# Patient Record
Sex: Female | Born: 1950 | Race: White | Hispanic: No | Smoking: Never smoker
Health system: Southern US, Community
[De-identification: ages and names within clinical notes are randomized; demographics above are authoritative.]

## PROBLEM LIST (undated history)

## (undated) DIAGNOSIS — K2210 Ulcer of esophagus without bleeding: Secondary | ICD-10-CM

## (undated) DIAGNOSIS — M810 Age-related osteoporosis without current pathological fracture: Secondary | ICD-10-CM

## (undated) DIAGNOSIS — D126 Benign neoplasm of colon, unspecified: Secondary | ICD-10-CM

## (undated) DIAGNOSIS — K648 Other hemorrhoids: Secondary | ICD-10-CM

## (undated) DIAGNOSIS — K449 Diaphragmatic hernia without obstruction or gangrene: Secondary | ICD-10-CM

## (undated) DIAGNOSIS — K219 Gastro-esophageal reflux disease without esophagitis: Secondary | ICD-10-CM

## (undated) DIAGNOSIS — E785 Hyperlipidemia, unspecified: Secondary | ICD-10-CM

## (undated) DIAGNOSIS — K317 Polyp of stomach and duodenum: Secondary | ICD-10-CM

## (undated) HISTORY — DX: Gastro-esophageal reflux disease without esophagitis: K21.9

## (undated) HISTORY — DX: Diaphragmatic hernia without obstruction or gangrene: K44.9

## (undated) HISTORY — PX: CARPAL TUNNEL RELEASE: SHX101

## (undated) HISTORY — DX: Ulcer of esophagus without bleeding: K22.10

## (undated) HISTORY — DX: Other hemorrhoids: K64.8

## (undated) HISTORY — DX: Age-related osteoporosis without current pathological fracture: M81.0

## (undated) HISTORY — DX: Benign neoplasm of colon, unspecified: D12.6

## (undated) HISTORY — DX: Polyp of stomach and duodenum: K31.7

## (undated) HISTORY — PX: FOOT SURGERY: SHX648

## (undated) HISTORY — PX: BREAST ENHANCEMENT SURGERY: SHX7

## (undated) HISTORY — DX: Hyperlipidemia, unspecified: E78.5

---

## 1992-08-12 DIAGNOSIS — D126 Benign neoplasm of colon, unspecified: Secondary | ICD-10-CM

## 1992-08-12 HISTORY — DX: Benign neoplasm of colon, unspecified: D12.6

## 1999-09-21 ENCOUNTER — Encounter: Payer: Self-pay | Admitting: Obstetrics and Gynecology

## 1999-09-21 ENCOUNTER — Encounter: Admission: RE | Admit: 1999-09-21 | Discharge: 1999-09-21 | Payer: Self-pay | Admitting: Obstetrics and Gynecology

## 2000-10-03 ENCOUNTER — Encounter: Admission: RE | Admit: 2000-10-03 | Discharge: 2000-10-03 | Payer: Self-pay | Admitting: Obstetrics and Gynecology

## 2000-10-03 ENCOUNTER — Encounter: Payer: Self-pay | Admitting: Obstetrics and Gynecology

## 2001-10-09 ENCOUNTER — Encounter: Admission: RE | Admit: 2001-10-09 | Discharge: 2001-10-09 | Payer: Self-pay | Admitting: Obstetrics and Gynecology

## 2001-10-09 ENCOUNTER — Encounter: Payer: Self-pay | Admitting: Obstetrics and Gynecology

## 2002-10-11 ENCOUNTER — Encounter: Admission: RE | Admit: 2002-10-11 | Discharge: 2002-10-11 | Payer: Self-pay | Admitting: Obstetrics and Gynecology

## 2002-10-11 ENCOUNTER — Encounter: Payer: Self-pay | Admitting: Obstetrics and Gynecology

## 2003-12-08 ENCOUNTER — Ambulatory Visit (HOSPITAL_COMMUNITY): Admission: RE | Admit: 2003-12-08 | Discharge: 2003-12-08 | Payer: Self-pay | Admitting: Obstetrics and Gynecology

## 2007-09-21 ENCOUNTER — Ambulatory Visit: Payer: Self-pay | Admitting: Gastroenterology

## 2007-10-05 ENCOUNTER — Ambulatory Visit: Payer: Self-pay | Admitting: Gastroenterology

## 2010-09-05 ENCOUNTER — Encounter: Payer: Self-pay | Admitting: Gastroenterology

## 2010-09-13 NOTE — Letter (Signed)
Summary: Colonoscopy Letter  [facility]  [address] [place], [place] [address]   Phone: [phone]  Fax: [phone]      September 05, 2010 MRN: [id number]   ORALIA CRIGER [address] [place], [place]  [address]   Dear Ms. Marcell,   According to your medical record, it is time for you to schedule a Colonoscopy. The American Cancer Society recommends this procedure as a method to detect early colon cancer. Patients with a family history of colon cancer, or a personal history of colon polyps or inflammatory bowel disease are at increased risk.  This letter has been generated based on the recommendations made at the time of your procedure. If you feel that in your particular situation this may no longer apply, please contact our office.  Please call our office at [phone] to schedule this appointment or to update your records at your earliest convenience.  Thank you for cooperating with [place] to provide you with the very best care possible.   Sincerely,  Judie Petit T. Russella Dar, M.D.  [facility] [phone]

## 2011-10-25 ENCOUNTER — Other Ambulatory Visit (HOSPITAL_COMMUNITY)
Admission: RE | Admit: 2011-10-25 | Discharge: 2011-10-25 | Disposition: A | Discharge status: Home / Self Care | Payer: 59 | Source: Ambulatory Visit | Attending: Obstetrics and Gynecology | Admitting: Obstetrics and Gynecology

## 2011-10-25 DIAGNOSIS — Z01419 Encounter for gynecological examination (general) (routine) without abnormal findings: Secondary | ICD-10-CM | POA: Insufficient documentation

## 2012-04-27 ENCOUNTER — Encounter: Payer: Self-pay | Admitting: Gastroenterology

## 2012-07-24 ENCOUNTER — Other Ambulatory Visit: Payer: Self-pay | Admitting: Obstetrics and Gynecology

## 2012-07-24 DIAGNOSIS — Z780 Asymptomatic menopausal state: Secondary | ICD-10-CM

## 2012-08-17 ENCOUNTER — Other Ambulatory Visit: Payer: Self-pay | Admitting: Obstetrics and Gynecology

## 2012-08-17 DIAGNOSIS — Z9882 Breast implant status: Secondary | ICD-10-CM

## 2012-08-17 DIAGNOSIS — Z1231 Encounter for screening mammogram for malignant neoplasm of breast: Secondary | ICD-10-CM

## 2012-08-20 ENCOUNTER — Encounter: Payer: Self-pay | Admitting: Gastroenterology

## 2012-08-20 ENCOUNTER — Ambulatory Visit (INDEPENDENT_AMBULATORY_CARE_PROVIDER_SITE_OTHER): Payer: Managed Care, Other (non HMO) | Admitting: Gastroenterology

## 2012-08-20 VITALS — BP 104/58 | HR 69 | Ht 61.5 in | Wt 129.4 lb

## 2012-08-20 DIAGNOSIS — Z860100 Personal history of colon polyps, unspecified: Secondary | ICD-10-CM | POA: Insufficient documentation

## 2012-08-20 DIAGNOSIS — Z800 Family history of malignant neoplasm of digestive organs: Secondary | ICD-10-CM

## 2012-08-20 DIAGNOSIS — R1012 Left upper quadrant pain: Secondary | ICD-10-CM

## 2012-08-20 DIAGNOSIS — R140 Abdominal distension (gaseous): Secondary | ICD-10-CM

## 2012-08-20 DIAGNOSIS — R143 Flatulence: Secondary | ICD-10-CM

## 2012-08-20 MED ORDER — PEG-KCL-NACL-NASULF-NA ASC-C 100 G PO SOLR: 1.0000 | Freq: Once | ORAL | Status: DC

## 2012-08-20 NOTE — Progress Notes (Signed)
History of Present Illness: This is a 62 year old female with a personal history of adenomatous colon polyps and a family history of colon cancer. She complains of frequent left upper quadrant pain associated with gas bloating and belching. Her symptoms seem to be more bothersome when under stress. Her last colonoscopy was normal in February 2009. Denies weight loss, constipation, diarrhea, change in stool caliber, melena, hematochezia, nausea, vomiting, dysphagia, reflux symptoms, chest pain.  Review of Systems: Pertinent positive and negative review of systems were noted in the above HPI section. All other review of systems were otherwise negative.  Current Medications, Allergies, Past Medical History, Past Surgical History, Family History and Social History were reviewed in [employer] record.  Physical Exam: General: Well developed , well nourished, no acute distress Head: Normocephalic and atraumatic Eyes:  sclerae anicteric, EOMI Ears: Normal auditory acuity Mouth: No deformity or lesions Neck: Supple, no masses or thyromegaly Lungs: Clear throughout to auscultation Heart: Regular rate and rhythm; no murmurs, rubs or bruits Abdomen: Soft, non tender and non distended. No masses, hepatosplenomegaly or hernias noted. Normal Bowel sounds Rectal: Deferred to colonoscopy Musculoskeletal: Symmetrical with no gross deformities  Skin: No lesions on visible extremities Pulses:  Normal pulses noted Extremities: No clubbing, cyanosis, edema or deformities noted Neurological: Alert oriented x 4, grossly nonfocal Cervical Nodes:  No significant cervical adenopathy Inguinal Nodes: No significant inguinal adenopathy Psychological:  Alert and cooperative. Normal mood and affect  Assessment and Recommendations:  1. Intermittent left upper quadrant pain associated with gas bloating and belching. Rule out gastritis, GERD and gas related pain. TUMS when necessary and Gas-X 4  times a day when necessary. Schedule upper endoscopy. The risks, benefits, and alternatives to endoscopy with possible biopsy and possible dilation were discussed with the patient and they consent to proceed.   2. Personal history of adenomatous colon polyps and a brother with colon cancer at age 52. She is due for surveillance colonoscopy. The risks, benefits, and alternatives to colonoscopy with possible biopsy and possible polypectomy were discussed with the patient and they consent to proceed.

## 2012-08-20 NOTE — Patient Instructions (Addendum)
You have been scheduled for an endoscopy and colonoscopy with propofol. Please follow the written instructions given to you at your visit today. Please pick up your prep at the pharmacy within the next 1-3 days. If you use inhalers (even only as needed) or a CPAP machine, please bring them with you on the day of your procedure.  Use over the counter Gas-X four times a day as needed for gas and bloating.

## 2012-09-15 ENCOUNTER — Other Ambulatory Visit: Payer: 59

## 2012-09-21 ENCOUNTER — Other Ambulatory Visit: Payer: 59

## 2012-09-29 ENCOUNTER — Encounter: Payer: 59 | Admitting: Gastroenterology

## 2012-10-26 ENCOUNTER — Ambulatory Visit
Admission: RE | Admit: 2012-10-26 | Discharge: 2012-10-26 | Disposition: A | Discharge status: Home / Self Care | Payer: Managed Care, Other (non HMO) | Source: Ambulatory Visit | Attending: Obstetrics and Gynecology | Admitting: Obstetrics and Gynecology

## 2012-10-26 DIAGNOSIS — Z1231 Encounter for screening mammogram for malignant neoplasm of breast: Secondary | ICD-10-CM

## 2012-10-26 DIAGNOSIS — Z9882 Breast implant status: Secondary | ICD-10-CM

## 2012-10-26 DIAGNOSIS — Z780 Asymptomatic menopausal state: Secondary | ICD-10-CM

## 2012-11-05 ENCOUNTER — Encounter: Payer: Self-pay | Admitting: Gastroenterology

## 2012-11-05 ENCOUNTER — Ambulatory Visit (AMBULATORY_SURGERY_CENTER): Payer: Managed Care, Other (non HMO) | Admitting: Gastroenterology

## 2012-11-05 VITALS — BP 108/58 | HR 67 | Temp 98.6°F | Resp 14 | Ht 61.5 in | Wt 129.0 lb

## 2012-11-05 DIAGNOSIS — Z1211 Encounter for screening for malignant neoplasm of colon: Secondary | ICD-10-CM

## 2012-11-05 DIAGNOSIS — D131 Benign neoplasm of stomach: Secondary | ICD-10-CM

## 2012-11-05 DIAGNOSIS — K317 Polyp of stomach and duodenum: Secondary | ICD-10-CM

## 2012-11-05 DIAGNOSIS — Z860100 Personal history of colon polyps, unspecified: Secondary | ICD-10-CM

## 2012-11-05 DIAGNOSIS — Z800 Family history of malignant neoplasm of digestive organs: Secondary | ICD-10-CM

## 2012-11-05 DIAGNOSIS — K209 Esophagitis, unspecified without bleeding: Secondary | ICD-10-CM

## 2012-11-05 DIAGNOSIS — R1012 Left upper quadrant pain: Secondary | ICD-10-CM

## 2012-11-05 MED ORDER — OMEPRAZOLE 40 MG PO CPDR: 40.0000 mg | DELAYED_RELEASE_CAPSULE | Freq: Every day | ORAL | Status: DC

## 2012-11-05 MED ORDER — SODIUM CHLORIDE 0.9 % IV SOLN: 500.0000 mL | INTRAVENOUS | Status: DC

## 2012-11-05 NOTE — Op Note (Signed)
[facility] [address] [place], [address]   COLONOSCOPY PROCEDURE REPORT  PATIENT: [name] [name]  MR#: [id number] BIRTHDATE: [birth year] , 61  yrs. old GENDER: Female ENDOSCOPIST: Meryl Dare, MD, [facility] PROCEDURE DATE:  11/05/2012 PROCEDURE:   Colonoscopy, screening ASA CLASS:   Class II INDICATIONS:Patient's personal history of adenomatous colon polyps and Patient's immediate family history of colon cancer. MEDICATIONS: MAC sedation, administered by CRNA and propofol (Diprivan) 250mg  IV DESCRIPTION OF PROCEDURE:   After the risks benefits and alternatives of the procedure were thoroughly explained, informed consent was obtained.  A digital rectal exam revealed no abnormalities of the rectum.   The LB CF-H180AL [id number]  endoscope was introduced through the anus and advanced to the cecum, which was identified by both the appendix and ileocecal valve. No adverse events experienced with a tortuous colon.   The quality of the prep was good, using MoviPrep  The instrument was then slowly withdrawn as the colon was fully examined.  COLON FINDINGS: A normal appearing cecum, ileocecal valve, and appendiceal orifice were identified.  The ascending, hepatic flexure, transverse, splenic flexure, descending, sigmoid colon and rectum appeared unremarkable.  No polyps or cancers were seen. Retroflexed views revealed small internal hemorrhoids. The time to cecum=3 minutes 52 seconds.  Withdrawal time=9 minutes 15 seconds. The scope was withdrawn and the procedure completed.  COMPLICATIONS: There were no complications.  ENDOSCOPIC IMPRESSION: 1.  Normal colon 2.  Small internal hemorrhoids  RECOMMENDATIONS: 1.  Repeat Colonoscopy in 5 years.  eSigned:  Meryl Dare, MD, [facility] 11/05/2012 10:24 AM

## 2012-11-05 NOTE — Patient Instructions (Addendum)

## 2012-11-05 NOTE — Progress Notes (Signed)
Patient did not experience any of the following events: a burn prior to discharge; a fall within the facility; wrong site/side/patient/procedure/implant event; or a hospital transfer or hospital admission upon discharge from the facility. (G8907)Patient did not have preoperative order for IV antibiotic SSI prophylaxis. [phone])  Took pt a while to pass gas but pt finally passing some air before she went home

## 2012-11-05 NOTE — Progress Notes (Signed)
Called to room to assist during endoscopic procedure.  Patient ID and intended procedure confirmed with present staff. Received instructions for my participation in the procedure from the performing physician.  

## 2012-11-05 NOTE — Op Note (Signed)
[facility] [address] [place], [address]   ENDOSCOPY PROCEDURE REPORT  PATIENT: [name] [name], [name] [name]  MR#: [id number] BIRTHDATE: 08-23-1950 , 61  yrs. old GENDER: Female ENDOSCOPIST: Meryl Dare, MD, [facility] PROCEDURE DATE:  11/05/2012 PROCEDURE:  EGD w/ biopsy ASA CLASS:     Class II INDICATIONS:  abdominal pain in upper left quadrant. MEDICATIONS: There was residual sedation effect present from prior procedure, MAC sedation, administered by CRNA, propofol (Diprivan) 100mg  IV TOPICAL ANESTHETIC: Cetacaine Spray DESCRIPTION OF PROCEDURE: After the risks benefits and alternatives of the procedure were thoroughly explained, informed consent was obtained.  The LB GIF-H180 [id number] endoscope was introduced through the mouth and advanced to the second portion of the duodenum without limitations.  The instrument was slowly withdrawn as the mucosa was fully examined.  ESOPHAGUS: There was LA Class B erosive esophagitis noted.   The esophagus was otherwise normal. STOMACH: Multiple smooth sessile polyps ranging between 3-72mm in size were found in the gastric body.  Multiple biopsies was performed.   The stomach otherwise appeared normal. DUODENUM: The duodenal mucosa showed no abnormalities in the bulb and second portion of the duodenum.  Retroflexed views revealed a small hiatal hernia.  The scope was then withdrawn from the patient and the procedure completed.  COMPLICATIONS: There were no complications.  ENDOSCOPIC IMPRESSION: 1.   LA Class B erosvie esophagitis 2.   Small hiatal hernia 3.   Multiple sessile polyps ranging between 3-76mm in size were found in the gastric body  RECOMMENDATIONS: 1.  Anti-reflux regimen long term 2.  PPI qam long term: omeprazole 40 mg po qam, 1 year of refills 3.  OP follow-up in 4-6 weeks. 4.  Await pathology results   eSigned:  Meryl Dare, MD, [facility] 11/05/2012 10:33 AM

## 2012-11-06 ENCOUNTER — Telehealth: Payer: Self-pay | Admitting: *Deleted

## 2012-11-06 NOTE — Telephone Encounter (Signed)
Mailbox not set up, follow-up  

## 2012-11-10 ENCOUNTER — Encounter: Payer: Self-pay | Admitting: Gastroenterology

## 2012-12-04 ENCOUNTER — Ambulatory Visit: Payer: Managed Care, Other (non HMO) | Admitting: Gastroenterology

## 2013-11-25 ENCOUNTER — Telehealth: Payer: Self-pay | Admitting: Gastroenterology

## 2013-11-25 DIAGNOSIS — R1012 Left upper quadrant pain: Secondary | ICD-10-CM

## 2013-11-25 DIAGNOSIS — K209 Esophagitis, unspecified without bleeding: Secondary | ICD-10-CM

## 2013-11-26 MED ORDER — OMEPRAZOLE 40 MG PO CPDR: 40.0000 mg | DELAYED_RELEASE_CAPSULE | Freq: Every day | ORAL | Status: DC

## 2013-11-26 NOTE — Telephone Encounter (Signed)
Informed patient that I can send a new prescription to her pharmacy. Asked patient why she did not pick up the prescription last year after her EGD. Patient states she has lost her prescription and only has reflux symptoms as needed. Told patient that we sent it to her pharmacy last year electronically and she it supposed to take this medication every day. Told patient she needs to make an appt for any more refills but I can send one until her follow up visit. Appt scheduled for 12/20/13.

## 2013-12-20 ENCOUNTER — Ambulatory Visit: Payer: Managed Care, Other (non HMO) | Admitting: Gastroenterology

## 2014-01-10 ENCOUNTER — Ambulatory Visit (INDEPENDENT_AMBULATORY_CARE_PROVIDER_SITE_OTHER): Payer: BC Managed Care – PPO | Admitting: Gastroenterology

## 2014-01-10 ENCOUNTER — Encounter: Payer: Self-pay | Admitting: Gastroenterology

## 2014-01-10 VITALS — BP 100/64 | HR 72 | Ht 61.5 in | Wt 135.8 lb

## 2014-01-10 DIAGNOSIS — K219 Gastro-esophageal reflux disease without esophagitis: Secondary | ICD-10-CM

## 2014-01-10 DIAGNOSIS — K209 Esophagitis, unspecified without bleeding: Secondary | ICD-10-CM

## 2014-01-10 DIAGNOSIS — R1012 Left upper quadrant pain: Secondary | ICD-10-CM

## 2014-01-10 MED ORDER — OMEPRAZOLE 40 MG PO CPDR: 40.0000 mg | DELAYED_RELEASE_CAPSULE | Freq: Every day | ORAL | Status: DC

## 2014-01-10 NOTE — Patient Instructions (Signed)
We have sent the following medications to your pharmacy for you to pick up at your convenience:omeprazole.  Thank you for choosing me and [facility].  Malcolm T. Stark, Jr., MD., FACG   

## 2014-01-10 NOTE — Progress Notes (Signed)
    History of Present Illness: This is a 63 year old female with GERD. EGD performed in 10/2012 showed LA class B erosive esophagitis, small hiatal hernia and benign fundic gland polyps. While off medication she noted frequent reflux symptoms. Upon resuming omeprazole her symptoms abated.  Current Medications, Allergies, Past Medical History, Past Surgical History, Family History and Social History were reviewed in [employer] record.  Physical Exam: General: Well developed , well nourished, no acute distress Head: Normocephalic and atraumatic Eyes:  sclerae anicteric, EOMI Ears: Normal auditory acuity Mouth: No deformity or lesions Lungs: Clear throughout to auscultation Heart: Regular rate and rhythm; no murmurs, rubs or bruits Abdomen: Soft, non tender and non distended. No masses, hepatosplenomegaly or hernias noted. Normal Bowel sounds Musculoskeletal: Symmetrical with no gross deformities  Pulses:  Normal pulses noted Extremities: No clubbing, cyanosis, edema or deformities noted Neurological: Alert oriented x 4, grossly nonfocal Psychological:  Alert and cooperative. Normal mood and affect  Assessment and Recommendations:  1. GERD with LA class B erosive esophagitis. Long-term antireflux measures and omeprazole 40 mg daily.  2. Family history of colon cancer. Surveillance colonoscopy recommended March 2019.

## 2014-02-01 ENCOUNTER — Telehealth: Payer: Self-pay | Admitting: Gastroenterology

## 2014-02-01 NOTE — Telephone Encounter (Signed)
Message copied by Oliva Bustard on Tue Feb 01, 2014  3:58 PM ------      Message from: Marzella Schlein      Created: Mon Dec 20, 2013  9:09 AM       Bill patient for same day cancellation ------

## 2014-03-26 ENCOUNTER — Encounter (HOSPITAL_COMMUNITY): Payer: Self-pay | Admitting: Emergency Medicine

## 2014-03-26 ENCOUNTER — Emergency Department (INDEPENDENT_AMBULATORY_CARE_PROVIDER_SITE_OTHER)
Admission: EM | Admit: 2014-03-26 | Discharge: 2014-03-26 | Disposition: A | Discharge status: Home / Self Care | Payer: BC Managed Care – PPO | Source: Home / Self Care

## 2014-03-26 DIAGNOSIS — T63461A Toxic effect of venom of wasps, accidental (unintentional), initial encounter: Secondary | ICD-10-CM

## 2014-03-26 DIAGNOSIS — T63441A Toxic effect of venom of bees, accidental (unintentional), initial encounter: Secondary | ICD-10-CM

## 2014-03-26 DIAGNOSIS — T6391XA Toxic effect of contact with unspecified venomous animal, accidental (unintentional), initial encounter: Secondary | ICD-10-CM

## 2014-03-26 MED ORDER — METHYLPREDNISOLONE SODIUM SUCC 125 MG IJ SOLR
INTRAMUSCULAR | Status: AC
  Filled 2014-03-26: qty 2

## 2014-03-26 MED ORDER — METHYLPREDNISOLONE SODIUM SUCC 125 MG IJ SOLR
80.0000 mg | Freq: Once | INTRAMUSCULAR | Status: AC
  Administered 2014-03-26: 80 mg via INTRAMUSCULAR

## 2014-03-26 MED ORDER — PREDNISONE 20 MG PO TABS: ORAL_TABLET | ORAL | Status: DC

## 2014-03-26 NOTE — ED Provider Notes (Signed)
CSN: [id number]     Arrival date & time 03/26/14  1844 History   First MD Initiated Contact with Patient 03/26/14 1919     Chief Complaint  Patient presents with  . Insect Bite   (Consider location/radiation/quality/duration/timing/severity/associated sxs/prior Treatment) HPI Comments: Bee sting to the right ring finger approx 1.5 h prior to being seen in UC. C/O local erythema, swelling and itching, Generalized itching and several areas of anterior trunk with urticaria. Stuffy nose has developed. No problems with breathing, swallowing, vision. No hx of anaphylaxis from bee stings.   Past Medical History  Diagnosis Date  . Hiatal hernia   . Adenomatous polyp of colon 1994  . Internal hemorrhoids   . Osteoporosis   . Erosive esophagitis   . Fundic gland polyposis of stomach    Past Surgical History  Procedure Laterality Date  . Foot surgery      bilateral  . Carpal tunnel release      right  . Breast enhancement surgery     Family History  Problem Relation Age of Onset  . Colon cancer Brother 12  . Hypertension Father   . Dementia Father    History  Substance Use Topics  . Smoking status: Never Smoker   . Smokeless tobacco: Never Used  . Alcohol Use: Yes     Comment: rarely   OB History   Grav Para Term Preterm Abortions TAB SAB Ect Mult Living                 Review of Systems  Constitutional: Negative for fever, chills, activity change and fatigue.  HENT: Positive for congestion. Negative for mouth sores, rhinorrhea, sinus pressure and sore throat.   Eyes: Negative for photophobia, pain, discharge, redness and itching.  Respiratory: Negative for cough, choking, chest tightness, shortness of breath, wheezing and stridor.   Cardiovascular: Negative for chest pain, palpitations and leg swelling.  Gastrointestinal: Negative for nausea and vomiting.  Genitourinary: Negative.   Musculoskeletal: Negative.   Skin: Positive for rash.  Neurological: Negative for  dizziness, tremors, seizures, syncope, speech difficulty, light-headedness, numbness and headaches.    Allergies  Review of patient's allergies indicates no known allergies.  Home Medications   Prior to Admission medications   Medication Sig Start Date End Date Taking? Authorizing Provider  Biotin 5000 MCG CAPS Take 1 capsule by mouth.    Historical Provider, MD  Calcium-Magnesium-Vitamin D (CALCIUM MAGNESIUM PO) Take 1 tablet by mouth.    Historical Provider, MD  Cholecalciferol (VITAMIN D) 2000 UNITS tablet Take 2,000 Units by mouth daily.    Historical Provider, MD  cyanocobalamin 500 MCG tablet Take 500 mcg by mouth daily.    Historical Provider, MD  Evening Primrose Oil CAPS Take 1 capsule by mouth daily.    Historical Provider, MD  GLUCOSAMINE PO Take 1 tablet by mouth daily.    Historical Provider, MD  omeprazole (PRILOSEC) 40 MG capsule Take 1 capsule (40 mg total) by mouth daily. 01/10/14   Ladene Artist, MD  predniSONE (DELTASONE) 20 MG tablet Take 2 tablets daily for 3 days. Take with food. Start 03/27/14 03/26/14   Janne Napoleon, NP  pyridOXINE (B-6) 50 MG tablet Take 50 mg by mouth daily.    Historical Provider, MD  vitamin C (ASCORBIC ACID) 500 MG tablet Take 500 mg by mouth daily.    Historical Provider, MD   There were no vitals taken for this visit. Physical Exam  Nursing note and vitals reviewed. Constitutional: She  is oriented to person, place, and time. She appears well-developed and well-nourished. No distress.  HENT:  Right Ear: External ear normal.  Left Ear: External ear normal.  Mouth/Throat: Oropharynx is clear and moist. No oropharyngeal exudate.  No intraoral structure swelling, edema or erythema. Normal swallow reflex, airway widely patent.  Eyes: Conjunctivae and EOM are normal. Pupils are equal, round, and reactive to light. Right eye exhibits no discharge. Left eye exhibits no discharge.  Neck: Normal range of motion. Neck supple.  Cardiovascular: Normal  rate, regular rhythm, normal heart sounds and intact distal pulses.   Pulmonary/Chest: Effort normal and breath sounds normal. No respiratory distress. She has no wheezes. She has no rales.  Musculoskeletal: She exhibits no edema and no tenderness.  Lymphadenopathy:    She has no cervical adenopathy.  Neurological: She is alert and oriented to person, place, and time. She exhibits normal muscle tone.  Skin: Skin is warm and dry. Rash noted.  Urticaria as in history of present illness. Cutaneous erythema and swelling to the site of bee sting to the right ring finger and dorsum of the hand.  Psychiatric: She has a normal mood and affect.    ED Course  Procedures (including critical care time) Labs Review Labs Reviewed - No data to display  Imaging Review No results found.   MDM   1. Bee sting reaction, accidental or unintentional, initial encounter    Solumedrol 80 mg IM Take benadryl 50 mg q 4h Prednisone 20 mg q d x 3 d,start tomorrow. For worsening go to the ED or call 911. Pt is stable at D/C      Janne Napoleon, NP 03/26/14 1942

## 2014-03-26 NOTE — Discharge Instructions (Signed)

## 2014-03-26 NOTE — ED Notes (Signed)
States she was stung by bee of some sort just PTA on her hand. Hand swollen. Patient NAD, w/d/color good, breathing w/o apparent difficulty, handling her own secretions w/o difficulty

## 2014-03-29 NOTE — ED Provider Notes (Signed)
Medical screening examination/treatment/procedure(s) were performed by resident physician or non-physician practitioner and as supervising physician I was immediately available for consultation/collaboration.   Pauline Good MD.   Billy Fischer, MD 03/29/14 [phone]

## 2016-06-24 ENCOUNTER — Other Ambulatory Visit: Payer: Self-pay | Admitting: Obstetrics and Gynecology

## 2016-06-24 DIAGNOSIS — M810 Age-related osteoporosis without current pathological fracture: Secondary | ICD-10-CM

## 2016-07-09 ENCOUNTER — Ambulatory Visit
Admission: RE | Admit: 2016-07-09 | Discharge: 2016-07-09 | Disposition: A | Discharge status: Home / Self Care | Payer: Self-pay | Source: Ambulatory Visit | Attending: Obstetrics and Gynecology | Admitting: Obstetrics and Gynecology

## 2016-07-09 DIAGNOSIS — M810 Age-related osteoporosis without current pathological fracture: Secondary | ICD-10-CM

## 2016-10-16 ENCOUNTER — Telehealth: Payer: Self-pay | Admitting: Gastroenterology

## 2016-10-16 NOTE — Telephone Encounter (Signed)
Patient reports that she is having pain in her upper back at about epigastric area.  She reports that the pain started on Monday after taking several doses of ibuprofen.  She stopped her omeprazole a long time ago.  She is asked to resume her omeprazole BID for 1 week then decrease to qd.  I gave her an appt for 10/24/16 to see Dr. Fuller Plan.

## 2016-10-24 ENCOUNTER — Ambulatory Visit: Payer: BLUE CROSS/BLUE SHIELD | Admitting: Gastroenterology

## 2017-10-18 ENCOUNTER — Encounter: Payer: Self-pay | Admitting: Gastroenterology

## 2018-03-19 DIAGNOSIS — H532 Diplopia: Secondary | ICD-10-CM | POA: Diagnosis not present

## 2018-03-19 DIAGNOSIS — H2513 Age-related nuclear cataract, bilateral: Secondary | ICD-10-CM | POA: Diagnosis not present

## 2018-04-07 DIAGNOSIS — Z0000 Encounter for general adult medical examination without abnormal findings: Secondary | ICD-10-CM | POA: Diagnosis not present

## 2018-04-07 DIAGNOSIS — Z1389 Encounter for screening for other disorder: Secondary | ICD-10-CM | POA: Diagnosis not present

## 2018-04-07 DIAGNOSIS — Z01419 Encounter for gynecological examination (general) (routine) without abnormal findings: Secondary | ICD-10-CM | POA: Diagnosis not present

## 2018-04-07 DIAGNOSIS — Z1231 Encounter for screening mammogram for malignant neoplasm of breast: Secondary | ICD-10-CM | POA: Diagnosis not present

## 2018-04-07 DIAGNOSIS — Z6824 Body mass index (BMI) 24.0-24.9, adult: Secondary | ICD-10-CM | POA: Diagnosis not present

## 2019-03-01 ENCOUNTER — Ambulatory Visit (AMBULATORY_SURGERY_CENTER): Payer: Self-pay

## 2019-03-01 ENCOUNTER — Other Ambulatory Visit: Payer: Self-pay

## 2019-03-01 VITALS — Ht 61.5 in | Wt 128.0 lb

## 2019-03-01 DIAGNOSIS — Z86010 Personal history of colonic polyps: Secondary | ICD-10-CM

## 2019-03-01 DIAGNOSIS — Z800 Family history of malignant neoplasm of digestive organs: Secondary | ICD-10-CM

## 2019-03-01 MED ORDER — NA SULFATE-K SULFATE-MG SULF 17.5-3.13-1.6 GM/177ML PO SOLN: 1.0000 | Freq: Once | ORAL | 0 refills | Status: AC

## 2019-03-01 NOTE — Progress Notes (Signed)
Denies allergies to eggs or soy products. Denies complication of anesthesia or sedation. Denies use of weight loss medication. Denies use of O2.   Emmi instructions given for colonoscopy.  Pre-Visit was conducted by phone due to Covid 19. Instructions were reviewed and mailed to patients confirmed home address. [employer] insurance does not cover Suprep so a Sample of Suprep was given to the patient. Patient was encouraged to call if she had any questions regarding instructions.

## 2019-03-12 ENCOUNTER — Telehealth: Payer: Self-pay | Admitting: Gastroenterology

## 2019-03-12 NOTE — Telephone Encounter (Signed)

## 2019-03-15 ENCOUNTER — Encounter: Payer: Self-pay | Admitting: Gastroenterology

## 2019-03-15 ENCOUNTER — Ambulatory Visit (AMBULATORY_SURGERY_CENTER): Payer: PPO | Admitting: Gastroenterology

## 2019-03-15 ENCOUNTER — Other Ambulatory Visit: Payer: Self-pay

## 2019-03-15 VITALS — BP 120/58 | HR 70 | Temp 97.3°F | Resp 17 | Ht 61.0 in | Wt 128.0 lb

## 2019-03-15 DIAGNOSIS — K635 Polyp of colon: Secondary | ICD-10-CM | POA: Diagnosis not present

## 2019-03-15 DIAGNOSIS — D125 Benign neoplasm of sigmoid colon: Secondary | ICD-10-CM | POA: Diagnosis not present

## 2019-03-15 DIAGNOSIS — Z1211 Encounter for screening for malignant neoplasm of colon: Secondary | ICD-10-CM | POA: Diagnosis not present

## 2019-03-15 DIAGNOSIS — Z800 Family history of malignant neoplasm of digestive organs: Secondary | ICD-10-CM

## 2019-03-15 DIAGNOSIS — D122 Benign neoplasm of ascending colon: Secondary | ICD-10-CM | POA: Diagnosis not present

## 2019-03-15 DIAGNOSIS — K219 Gastro-esophageal reflux disease without esophagitis: Secondary | ICD-10-CM | POA: Diagnosis not present

## 2019-03-15 DIAGNOSIS — Z86010 Personal history of colonic polyps: Secondary | ICD-10-CM | POA: Diagnosis not present

## 2019-03-15 DIAGNOSIS — E7800 Pure hypercholesterolemia, unspecified: Secondary | ICD-10-CM | POA: Diagnosis not present

## 2019-03-15 MED ORDER — SODIUM CHLORIDE 0.9 % IV SOLN: 500.0000 mL | Freq: Once | INTRAVENOUS | Status: DC

## 2019-03-15 NOTE — Op Note (Addendum)
[facility] Patient Name: [name] [name] Procedure Date: 03/15/2019 10:25 AM MRN: [id number] Endoscopist: Ladene Artist , MD Age: 68 Referring MD:  Date of Birth: [birth year] Gender: Female Account #: [id number] Procedure:                Colonoscopy Indications:              Surveillance: Personal history of adenomatous                            polyps on last colonoscopy > 5 years ago. Family                            history of colon cancer, brother. Medicines:                Monitored Anesthesia Care Procedure:                Pre-Anesthesia Assessment:                           - Prior to the procedure, a History and Physical                            was performed, and patient medications and                            allergies were reviewed. The patient's tolerance of                            previous anesthesia was also reviewed. The risks                            and benefits of the procedure and the sedation                            options and risks were discussed with the patient.                            All questions were answered, and informed consent                            was obtained. Prior Anticoagulants: The patient has                            taken no previous anticoagulant or antiplatelet                            agents. ASA Grade Assessment: II - A patient with                            mild systemic disease. After reviewing the risks                            and benefits, the patient was deemed in  satisfactory condition to undergo the procedure.                           After obtaining informed consent, the colonoscope                            was passed under direct vision. Throughout the                            procedure, the patient's blood pressure, pulse, and                            oxygen saturations were monitored continuously. The                            Colonoscope was introduced  through the anus and                            advanced to the the cecum, identified by                            appendiceal orifice and ileocecal valve. The                            ileocecal valve, appendiceal orifice, and rectum                            were photographed. The quality of the bowel                            preparation was excellent. The colonoscopy was                            performed without difficulty. The patient tolerated                            the procedure well. Scope In: 10:34:18 AM Scope Out: 10:51:19 AM Scope Withdrawal Time: 0 hours 14 minutes 13 seconds  Total Procedure Duration: 0 hours 17 minutes 1 second  Findings:                 The perianal and digital rectal examinations were                            normal.                           Two sessile polyps were found in the sigmoid colon                            and ascending colon. The polyps were 6 mm in size.                            These polyps were removed with a cold snare.  Resection and retrieval were complete.                           The exam was otherwise without abnormality on                            direct views. Unable to safely retroflex with                            narrow rectal vault and inability of rectum to                            maintain air insufflation. Complications:            No immediate complications. Estimated blood loss:                            None. Estimated Blood Loss:     Estimated blood loss: none. Impression:               - Two 6 mm polyps in the sigmoid colon and in the                            ascending colon, removed with a cold snare.                            Resected and retrieved.                           - The examination was otherwise normal on direct                            views. Recommendation:           - Repeat colonoscopy in 5 years for surveillance,                             pending pathology review.                           - Patient has a contact number available for                            emergencies. The signs and symptoms of potential                            delayed complications were discussed with the                            patient. Return to normal activities tomorrow.                            Written discharge instructions were provided to the                            patient.                           -  Resume previous diet.                           - Continue present medications.                           - Await pathology results.                           - Schedule office appointment for follow up of GERD                            with erosive esophagitis. Currently maintained on                            Nexium 20 mg OTC po qd. Ladene Artist, MD 03/15/2019 10:58:41 AM This report has been signed electronically.

## 2019-03-15 NOTE — Progress Notes (Signed)
Pt's states no medical or surgical changes since previsit or office visit.   JB temps and [place] vitals. Sm

## 2019-03-15 NOTE — Progress Notes (Signed)
Report given to PACU, vss 

## 2019-03-15 NOTE — Patient Instructions (Signed)
Information on polyps given to you today.  Await pathology results.   YOU HAD AN ENDOSCOPIC PROCEDURE TODAY AT THE [facility]:   Refer to the procedure report that was given to you for any specific questions about what was found during the examination.  If the procedure report does not answer your questions, please call your gastroenterologist to clarify.  If you requested that your care partner not be given the details of your procedure findings, then the procedure report has been included in a sealed envelope for you to review at your convenience later.  YOU SHOULD EXPECT: Some feelings of bloating in the abdomen. Passage of more gas than usual.  Walking can help get rid of the air that was put into your GI tract during the procedure and reduce the bloating. If you had a lower endoscopy (such as a colonoscopy or flexible sigmoidoscopy) you may notice spotting of blood in your stool or on the toilet paper. If you underwent a bowel prep for your procedure, you may not have a normal bowel movement for a few days.  Please Note:  You might notice some irritation and congestion in your nose or some drainage.  This is from the oxygen used during your procedure.  There is no need for concern and it should clear up in a day or so.  SYMPTOMS TO REPORT IMMEDIATELY:   Following lower endoscopy (colonoscopy or flexible sigmoidoscopy):  Excessive amounts of blood in the stool  Significant tenderness or worsening of abdominal pains  Swelling of the abdomen that is new, acute  Fever of 100F or higher   For urgent or emergent issues, a gastroenterologist can be reached at any hour by calling [phone].   DIET:  We do recommend a small meal at first, but then you may proceed to your regular diet.  Drink plenty of fluids but you should avoid alcoholic beverages for 24 hours.  ACTIVITY:  You should plan to take it easy for the rest of today and you should NOT DRIVE or use heavy machinery  until tomorrow (because of the sedation medicines used during the test).    FOLLOW UP: Our staff will call the number listed on your records 48-72 hours following your procedure to check on you and address any questions or concerns that you may have regarding the information given to you following your procedure. If we do not reach you, we will leave a message.  We will attempt to reach you two times.  During this call, we will ask if you have developed any symptoms of COVID 19. If you develop any symptoms (ie: fever, flu-like symptoms, shortness of breath, cough etc.) before then, please call [phone].  If you test positive for Covid 19 in the 2 weeks post procedure, please call and report this information to us.    If any biopsies were taken you will be contacted by phone or by letter within the next 1-3 weeks.  Please call us at [phone] if you have not heard about the biopsies in 3 weeks.    SIGNATURES/CONFIDENTIALITY: You and/or your care partner have signed paperwork which will be entered into your electronic medical record.  These signatures attest to the fact that that the information above on your After Visit Summary has been reviewed and is understood.  Full responsibility of the confidentiality of this discharge information lies with you and/or your care-partner. 

## 2019-03-15 NOTE — Progress Notes (Signed)
Called to room to assist during endoscopic procedure.  Patient ID and intended procedure confirmed with present staff. Received instructions for my participation in the procedure from the performing physician.  

## 2019-03-17 ENCOUNTER — Telehealth: Payer: Self-pay

## 2019-03-17 NOTE — Telephone Encounter (Signed)
Left message on follow up call. 

## 2019-03-28 ENCOUNTER — Encounter: Payer: Self-pay | Admitting: Gastroenterology

## 2019-04-13 ENCOUNTER — Encounter: Payer: Self-pay | Admitting: Gastroenterology

## 2019-04-13 ENCOUNTER — Ambulatory Visit (INDEPENDENT_AMBULATORY_CARE_PROVIDER_SITE_OTHER): Payer: PPO | Admitting: Gastroenterology

## 2019-04-13 VITALS — BP 118/64 | HR 75 | Temp 98.5°F | Ht 61.5 in | Wt 129.0 lb

## 2019-04-13 DIAGNOSIS — K2100 Gastro-esophageal reflux disease with esophagitis, without bleeding: Secondary | ICD-10-CM

## 2019-04-13 NOTE — Patient Instructions (Addendum)
Hold your Nexium and start over the counter Pepcid AC 2 capsules by mouth twice daily for several days. If your symptoms are not better then resume Nexium daily.   Normal BMI (Body Mass Index- based on height and weight) is between 23 and 30. Your BMI today is Body mass index is 23.98 kg/m. Marland Kitchen Please consider follow up  regarding your BMI with your Primary Care Provider.  Thank you for choosing me and [facility].  Pricilla Riffle. Dagoberto Ligas., MD., Marval Regal

## 2019-04-13 NOTE — Progress Notes (Signed)
    History of Present Illness: This is a 68 year old female for follow-up of erosive esophagitis.  EGD performed in 2014 showed LA class B erosive esophagitis and a small hiatal hernia.  Her symptoms have been under very good control on daily Nexium qpm and antireflux measures.  She has concerns about information she is seen in the media about the risks of PPI medications and wishes to further discuss.  She carries a diagnosis of osteoporosis.  She states if she misses a dose of Nexium or goes more than 24 hours without a dose she has a return of heartburn.  She underwent colonoscopy recently with a small tubular adenoma and a small sessile serrated polyp removed.  Current Medications, Allergies, Past Medical History, Past Surgical History, Family History and Social History were reviewed in [employer] record.  Physical Exam: General: Well developed, well nourished, no acute distress Head: Normocephalic and atraumatic Eyes:  sclerae anicteric, EOMI Ears: Normal auditory acuity Mouth: No deformity or lesions Lungs: Clear throughout to auscultation Heart: Regular rate and rhythm; no murmurs, rubs or bruits Abdomen: Soft, non tender and non distended. No masses, hepatosplenomegaly or hernias noted. Normal Bowel sounds Rectal: Not done Musculoskeletal: Symmetrical with no gross deformities  Pulses:  Normal pulses noted Extremities: No clubbing, cyanosis, edema or deformities noted Neurological: Alert oriented x 4, grossly nonfocal Psychological:  Alert and cooperative. Normal mood and affect   Assessment and Recommendations:  1.  LA Class B erosive esophagitis.  Discussed management of GERD with erosive esophagitis and the risk, benefits and alternatives to PPI usage.  We discussed indications for repeat EGD such as dysphagia, weight loss, epigastric pain, worsening GERD symptoms. I addressed her questions and concerns to her satisfaction.  Trial of famotidine 20 mg p.o.  twice daily.  If not effective she will return to Nexium 20 mg every afternoon.  Follow antireflux measures closely.  2.  Family history of colon cancer, personal history of colon polyps.  5-year interval colonoscopy is recommended in August 2025.

## 2019-05-12 ENCOUNTER — Other Ambulatory Visit: Payer: Self-pay | Admitting: Obstetrics and Gynecology

## 2019-05-12 DIAGNOSIS — Z6824 Body mass index (BMI) 24.0-24.9, adult: Secondary | ICD-10-CM | POA: Diagnosis not present

## 2019-05-12 DIAGNOSIS — Z124 Encounter for screening for malignant neoplasm of cervix: Secondary | ICD-10-CM | POA: Diagnosis not present

## 2019-05-12 DIAGNOSIS — Z0000 Encounter for general adult medical examination without abnormal findings: Secondary | ICD-10-CM | POA: Diagnosis not present

## 2019-05-12 DIAGNOSIS — Z01419 Encounter for gynecological examination (general) (routine) without abnormal findings: Secondary | ICD-10-CM | POA: Diagnosis not present

## 2019-05-12 DIAGNOSIS — Q782 Osteopetrosis: Secondary | ICD-10-CM

## 2019-05-12 DIAGNOSIS — Z1389 Encounter for screening for other disorder: Secondary | ICD-10-CM | POA: Diagnosis not present

## 2019-05-12 DIAGNOSIS — M810 Age-related osteoporosis without current pathological fracture: Secondary | ICD-10-CM | POA: Diagnosis not present

## 2019-05-12 DIAGNOSIS — Z1231 Encounter for screening mammogram for malignant neoplasm of breast: Secondary | ICD-10-CM | POA: Diagnosis not present

## 2019-06-07 ENCOUNTER — Other Ambulatory Visit: Payer: Self-pay

## 2019-06-07 ENCOUNTER — Ambulatory Visit (INDEPENDENT_AMBULATORY_CARE_PROVIDER_SITE_OTHER): Payer: PPO | Admitting: Family Medicine

## 2019-06-07 ENCOUNTER — Encounter: Payer: Self-pay | Admitting: Family Medicine

## 2019-06-07 VITALS — BP 130/80 | HR 74 | Temp 97.7°F | Resp 16 | Ht 60.0 in | Wt 127.0 lb

## 2019-06-07 DIAGNOSIS — R8290 Unspecified abnormal findings in urine: Secondary | ICD-10-CM | POA: Diagnosis not present

## 2019-06-07 DIAGNOSIS — Z9189 Other specified personal risk factors, not elsewhere classified: Secondary | ICD-10-CM

## 2019-06-07 DIAGNOSIS — Z0000 Encounter for general adult medical examination without abnormal findings: Secondary | ICD-10-CM

## 2019-06-07 DIAGNOSIS — E785 Hyperlipidemia, unspecified: Secondary | ICD-10-CM | POA: Diagnosis not present

## 2019-06-07 DIAGNOSIS — Z1159 Encounter for screening for other viral diseases: Secondary | ICD-10-CM | POA: Diagnosis not present

## 2019-06-07 DIAGNOSIS — Z23 Encounter for immunization: Secondary | ICD-10-CM | POA: Diagnosis not present

## 2019-06-07 LAB — LIPID PANEL
Cholesterol: 257 mg/dL — ABNORMAL HIGH (ref 0–200)
HDL: 84.9 mg/dL (ref >39.00)
LDL Cholesterol: 146 mg/dL — ABNORMAL HIGH (ref 0–99)
NonHDL: 172.51
Total CHOL/HDL Ratio: 3
Triglycerides: 132 mg/dL (ref 0.0–149.0)
VLDL: 26.4 mg/dL (ref 0.0–40.0)

## 2019-06-07 LAB — COMPREHENSIVE METABOLIC PANEL
ALT: 20 U/L (ref 0–35)
AST: 21 U/L (ref 0–37)
Albumin: 4.6 g/dL (ref 3.5–5.2)
Alkaline Phosphatase: 72 U/L (ref 39–117)
BUN: 12 mg/dL (ref 6–23)
CO2: 30 mEq/L (ref 19–32)
Calcium: 9.7 mg/dL (ref 8.4–10.5)
Chloride: 102 mEq/L (ref 96–112)
Creatinine, Ser: 0.76 mg/dL (ref 0.40–1.20)
GFR: 75.71 mL/min (ref >60.00)
Glucose, Bld: 90 mg/dL (ref 70–99)
Potassium: 4 mEq/L (ref 3.5–5.1)
Sodium: 140 mEq/L (ref 135–145)
Total Bilirubin: 0.4 mg/dL (ref 0.2–1.2)
Total Protein: 6.6 g/dL (ref 6.0–8.3)

## 2019-06-07 LAB — URINALYSIS, ROUTINE W REFLEX MICROSCOPIC
Bilirubin Urine: NEGATIVE
Hgb urine dipstick: NEGATIVE
Ketones, ur: NEGATIVE
Leukocytes,Ua: NEGATIVE
Nitrite: NEGATIVE
RBC / HPF: NONE SEEN (ref 0–?)
Specific Gravity, Urine: 1.015 (ref 1.000–1.030)
Total Protein, Urine: NEGATIVE
Urine Glucose: NEGATIVE
Urobilinogen, UA: 0.2 (ref 0.0–1.0)
pH: 7 (ref 5.0–8.0)

## 2019-06-07 NOTE — Patient Instructions (Signed)
Today you have you routine preventive visit. A few things to remember from today's visit:   Routine general medical examination at a health care facility  Hyperlipidemia, unspecified hyperlipidemia type - Plan: Comprehensive metabolic panel, Lipid panel  Encounter for HCV screening test for high risk patient - Plan: Hepatitis C antibody screen   At least 150 minutes of moderate exercise per week, daily brisk walking for 15-30 min is a good exercise option. Healthy diet low in saturated (animal) fats and sweets and consisting of fresh fruits and vegetables, lean meats such as fish and white chicken and whole grains.  These are some of recommendations for screening depending of age and risk factors:   - Vaccines:  Tdap vaccine every 10 years.  Shingles vaccine recommended at age 9, could be given after 68 years of age but not sure about insurance coverage.   Pneumonia vaccines:  Prevnar 13 at 65 and Pneumovax at 81. Sometimes Pneumovax is giving earlier if history of smoking, lung disease,diabetes,kidney disease among some.  Screening for diabetes at age 89 and every 3 years.  Cervical cancer prevention:  Pap smear starts at 68 years of age and continues periodically until 68 years old in low risk women. Pap smear every 3 years between 43 and 46 years old. Pap smear every 3-5 years between women 2 and older if pap smear negative and HPV screening negative.   -Breast cancer: Mammogram: There is disagreement between experts about when to start screening in low risk asymptomatic female but recent recommendations are to start screening at 70 and not later than 68 years old , every 1-2 years and after 68 yo q 2 years. Screening is recommended until 68 years old but some women can continue screening depending of healthy issues.   Colon cancer screening: starts at 68 years old until 68 years old.  Also recommended:  1. Dental visit- Brush and floss your teeth twice daily; visit your  dentist twice a year. 2. Eye doctor- Get an eye exam at least every 2 years. 3. Helmet use- Always wear a helmet when riding a bicycle, motorcycle, rollerblading or skateboarding. 4. Safe sex- If you may be exposed to sexually transmitted infections, use a condom. 5. Seat belts- Seat belts can save your live; always wear one. 6. Smoke/Carbon Monoxide detectors- These detectors need to be installed on the appropriate level of your home. Replace batteries at least once a year. 7. Skin cancer- When out in the sun please cover up and use sunscreen 15 SPF or higher. 8. Violence- If anyone is threatening or hurting you, please tell your healthcare provider.  9. Drink alcohol in moderation- Limit alcohol intake to one drink or less per day. Never drink and drive.

## 2019-06-07 NOTE — Progress Notes (Signed)
HPI:   Ms.Keerstin A Potthast is a 68 y.o. female, who is here today to establish care.  Former PCP: N/A Last preventive routine visit: She has not had one in many years. Recently seen by her gyn, 9/30.20.  Chronic medical problems: GERD,HLD,and osteoporosis, controlled. Last DEXA about 2 years ago, she follows with her gynecologist. She took Actonel 15+ years ago, discontinued after a year because she was afraid of side effects. Currently she is on calcium and vitamin D supplementation.  HLD: She is not taking medication. GERD: She is on Nexium 20 mg daily.   Concerns today: She would like blood work done today, she is also due for her CPE. Also reports that her gyn heard a heart murmur last visit. Denies symptoms.  She stays alone at home most of the time. Her husband travels during week days and comes home weekends.  Pap smear in 04/2018. Colonoscopy: 03/15/19. Mammogram: At her gyn's office. DEXA: 06/2016.  She does not exercise regularly. In regard to following  Healthful diet, "try to."  Today she is c/o intermittent odorous urine. Denies dysuria,increased urinary frequency, gross hematuria,or decreased urine output.   Review of Systems  Constitutional: Negative for activity change, appetite change, fatigue, fever and unexpected weight change.  HENT: Negative for mouth sores, nosebleeds and trouble swallowing.   Eyes: Negative for redness and visual disturbance.  Respiratory: Negative for cough, shortness of breath and wheezing.   Cardiovascular: Negative for chest pain, palpitations and leg swelling.  Gastrointestinal: Negative for abdominal pain, nausea and vomiting.       Negative for changes in bowel habits.  Endocrine: Negative for cold intolerance, heat intolerance, polydipsia, polyphagia and polyuria.  Genitourinary: Negative for decreased urine volume, difficulty urinating and flank pain.  Musculoskeletal: Negative for gait problem and myalgias.  Skin:  Negative for rash and wound.  Allergic/Immunologic: Negative for environmental allergies.  Neurological: Negative for syncope, weakness and headaches.  Hematological: Negative for adenopathy. Does not bruise/bleed easily.  Psychiatric/Behavioral: Negative for confusion. The patient is not nervous/anxious.   All other systems reviewed and are negative.   Current Outpatient Medications on File Prior to Visit  Medication Sig Dispense Refill  . Biotin 5000 MCG CAPS Take 1 capsule by mouth.    . Calcium-Magnesium-Vitamin D (CALCIUM MAGNESIUM PO) Take 1 tablet by mouth.    . Cholecalciferol (VITAMIN D) 2000 UNITS tablet Take 2,000 Units by mouth daily.    . cyanocobalamin 500 MCG tablet Take 500 mcg by mouth daily.    Marland Kitchen esomeprazole (NEXIUM) 20 MG packet Take 20 mg by mouth daily before breakfast.    . GLUCOSAMINE PO Take 1 tablet by mouth daily.    Marland Kitchen VITAMIN K PO Take by mouth daily.     No current facility-administered medications on file prior to visit.      Past Medical History:  Diagnosis Date  . Adenomatous polyp of colon 1994  . Erosive esophagitis   . Fundic gland polyposis of stomach   . GERD (gastroesophageal reflux disease)   . Hiatal hernia   . Hyperlipidemia   . Internal hemorrhoids   . Osteoporosis    No Known Allergies  Family History  Problem Relation Age of Onset  . Hypertension Father   . Dementia Father   . Colon cancer Brother 38  . Esophageal cancer Neg Hx   . Rectal cancer Neg Hx   . Stomach cancer Neg Hx     Social History   Socioeconomic  History  . Marital status: Divorced    Spouse name: Not on file  . Number of children: 1  . Years of education: Not on file  . Highest education level: Not on file  Occupational History  . Occupation: rETIRED  Scientific laboratory technician  . Financial resource strain: Not on file  . Food insecurity    Worry: Not on file    Inability: Not on file  . Transportation needs    Medical: Not on file    Non-medical: Not on file   Tobacco Use  . Smoking status: Never Smoker  . Smokeless tobacco: Never Used  Substance and Sexual Activity  . Alcohol use: Yes    Comment: rarely  . Drug use: No  . Sexual activity: Not on file  Lifestyle  . Physical activity    Days per week: Not on file    Minutes per session: Not on file  . Stress: Not on file  Relationships  . Social Herbalist on phone: Not on file    Gets together: Not on file    Attends religious service: Not on file    Active member of club or organization: Not on file    Attends meetings of clubs or organizations: Not on file    Relationship status: Not on file  Other Topics Concern  . Not on file  Social History Narrative  . Not on file    Vitals:   06/07/19 0841  BP: 130/80  Pulse: 74  Resp: 16  Temp: 97.7 F (36.5 C)  SpO2: 98%    Body mass index is 24.8 kg/m.  Physical Exam  Nursing note and vitals reviewed. Constitutional: She is oriented to person, place, and time. She appears well-developed and well-nourished. No distress.  HENT:  Head: Normocephalic and atraumatic.  Right Ear: Hearing, tympanic membrane, external ear and ear canal normal.  Left Ear: Hearing, tympanic membrane, external ear and ear canal normal.  Mouth/Throat: Uvula is midline, oropharynx is clear and moist and mucous membranes are normal.  Eyes: Pupils are equal, round, and reactive to light. Conjunctivae and EOM are normal.  Neck: No tracheal deviation present. No thyromegaly present.  Cardiovascular: Normal rate and regular rhythm.  No murmur heard. Pulses:      Dorsalis pedis pulses are 2+ on the right side and 2+ on the left side.  Respiratory: Effort normal and breath sounds normal. No respiratory distress.  GI: Soft. She exhibits no mass. There is no hepatomegaly. There is no abdominal tenderness.  Genitourinary:    Genitourinary Comments: Deferred to gyn.   Musculoskeletal:        General: No edema.     Comments: No major deformity or  signs of synovitis appreciated.  Lymphadenopathy:    She has no cervical adenopathy.       Right: No supraclavicular adenopathy present.       Left: No supraclavicular adenopathy present.  Neurological: She is alert and oriented to person, place, and time. She has normal strength. No cranial nerve deficit. Coordination and gait normal.  Reflex Scores:      Bicep reflexes are 2+ on the right side and 2+ on the left side.      Patellar reflexes are 2+ on the right side and 2+ on the left side. Skin: Skin is warm. No rash noted. No erythema.  Psychiatric: She has a normal mood and affect.  Well groomed, good eye contact.    ASSESSMENT AND PLAN:  Ms.  Maheen was seen today for establish care and annual exam.  Diagnoses and all orders for this visit:  Lab Results  Component Value Date   CREATININE 0.76 06/07/2019   BUN 12 06/07/2019   NA 140 06/07/2019   K 4.0 06/07/2019   CL 102 06/07/2019   CO2 30 06/07/2019   Lab Results  Component Value Date   CHOL 257 (H) 06/07/2019   HDL 84.90 06/07/2019   LDLCALC 146 (H) 06/07/2019   TRIG 132.0 06/07/2019   CHOLHDL 3 06/07/2019    Routine general medical examination at a health care facility We discussed the importance of regular physical activity and healthy diet for prevention of chronic illness and/or complications. Preventive guidelines reviewed. Vaccination pneumovax and flu vaccine given. Tdap and shingrix at her pharmacy. Continue following with gyn for ehr female preventive care. Ca++ and vit D supplementation recommended. Next CPE in a year.  The 10-year ASCVD risk score Mikey Bussing DC Brooke Bonito., et al., 2013) is: 6.9%   Values used to calculate the score:     Age: 48 years     Sex: Female     Is Non-Hispanic African American: No     Diabetic: No     Tobacco smoker: No     Systolic Blood Pressure: [id number]     Is BP treated: No     HDL Cholesterol: 84.9 mg/dL     Total Cholesterol: 257 mg/dL  Hyperlipidemia, unspecified  hyperlipidemia type Low fat diet recommended. Further recommendations will be given according to FLP results.  Encounter for HCV screening test for high risk patient -     Hepatitis C antibody screen  Bad odor of urine  Adequate hydration. Recommendations will be given according to UA results. -     Urinalysis, Routine w reflex microscopic  Need for immunization against influenza -     Flu Vaccine QUAD High Dose(Fluad)  Need for 23-polyvalent pneumococcal polysaccharide vaccine -     Pneumococcal polysaccharide vaccine 23-valent greater than or equal to 2yo subcutaneous/IM   Return in 1 year (on 06/06/2020) for cpe and wva.   Siraj Dermody G. [place], MD  [facility]. [place] office.

## 2019-06-08 LAB — HEPATITIS C ANTIBODY
Hepatitis C Ab: NONREACTIVE
SIGNAL TO CUT-OFF: 0.01 (ref <1.00)

## 2019-06-09 ENCOUNTER — Encounter: Payer: Self-pay | Admitting: Family Medicine

## 2019-07-20 ENCOUNTER — Telehealth: Payer: Self-pay | Admitting: Gastroenterology

## 2019-07-20 MED ORDER — FAMOTIDINE 20 MG PO TABS: 20.0000 mg | ORAL_TABLET | Freq: Two times a day (BID) | ORAL | 11 refills | Status: DC

## 2019-07-20 NOTE — Telephone Encounter (Signed)
Patient would prefer a prescription for pepcid sent to the pharmacy instead of her purchasing it over the counter. Informed patient I will send a prescription to [facility].

## 2019-07-23 ENCOUNTER — Other Ambulatory Visit: Payer: Self-pay | Admitting: Obstetrics and Gynecology

## 2019-07-23 DIAGNOSIS — M810 Age-related osteoporosis without current pathological fracture: Secondary | ICD-10-CM

## 2019-07-27 ENCOUNTER — Other Ambulatory Visit: Payer: PPO

## 2019-10-12 NOTE — Telephone Encounter (Signed)
Pls call pt. She would like to speak with you about prescription for pepcid.

## 2019-10-12 NOTE — Telephone Encounter (Signed)
Attempted to return patient's call and patient did not answer and no voicemail to leave a message.

## 2019-10-13 MED ORDER — PEPCID 20 MG PO TABS: 20.0000 mg | ORAL_TABLET | Freq: Two times a day (BID) | ORAL | 5 refills | Status: DC

## 2019-10-13 NOTE — Telephone Encounter (Signed)
Patient states she would like a prescription for brand name Pepcid. She states the generic is not working as well and she just wants to try brand name. Prescription sent to [facility] for brand name Pepcid.

## 2019-10-13 NOTE — Telephone Encounter (Signed)
Left message for patient to return my call.

## 2019-10-13 NOTE — Addendum Note (Signed)
Addended by: Dorisann Frames L on: 10/13/2019 01:45 PM   Modules accepted: Orders

## 2019-10-16 ENCOUNTER — Ambulatory Visit: Payer: PPO | Attending: Internal Medicine

## 2019-10-16 DIAGNOSIS — Z23 Encounter for immunization: Secondary | ICD-10-CM | POA: Insufficient documentation

## 2019-10-16 NOTE — Progress Notes (Signed)
   Covid-19 Vaccination Clinic  Name:  [name] [name]    MRN: [id number] DOB: [birth year]  10/16/2019  [name] was observed post Covid-19 immunization for 15 minutes without incident. She was provided with Vaccine Information Sheet and instruction to access the V-Safe system.   [name] was instructed to call 911 with any severe reactions post vaccine: Marland Kitchen Difficulty breathing  . Swelling of face and throat  . A fast heartbeat  . A bad rash all over body  . Dizziness and weakness   Immunizations Administered    Name Date Dose VIS Date Route   Pfizer COVID-19 Vaccine 10/16/2019  4:51 PM 0.3 mL 07/23/2019 Intramuscular   Manufacturer: [place]   Lot: [id number]   [place]: [id number]

## 2019-10-18 ENCOUNTER — Other Ambulatory Visit: Payer: Self-pay | Admitting: Gastroenterology

## 2019-10-18 ENCOUNTER — Other Ambulatory Visit: Payer: Self-pay

## 2019-10-18 MED ORDER — PEPCID 20 MG PO TABS: 20.0000 mg | ORAL_TABLET | Freq: Two times a day (BID) | ORAL | 5 refills | Status: DC

## 2019-10-18 NOTE — Telephone Encounter (Signed)
Brand name Pepcid refilled, patient up to date on office visits.

## 2019-10-25 ENCOUNTER — Other Ambulatory Visit: Payer: PPO

## 2019-11-01 ENCOUNTER — Other Ambulatory Visit: Payer: Self-pay

## 2019-11-01 ENCOUNTER — Ambulatory Visit
Admission: RE | Admit: 2019-11-01 | Discharge: 2019-11-01 | Disposition: A | Discharge status: Home / Self Care | Payer: PPO | Source: Ambulatory Visit | Attending: Obstetrics and Gynecology | Admitting: Obstetrics and Gynecology

## 2019-11-01 DIAGNOSIS — M810 Age-related osteoporosis without current pathological fracture: Secondary | ICD-10-CM | POA: Diagnosis not present

## 2019-11-01 DIAGNOSIS — Z780 Asymptomatic menopausal state: Secondary | ICD-10-CM | POA: Diagnosis not present

## 2019-11-06 ENCOUNTER — Ambulatory Visit: Payer: PPO | Attending: Internal Medicine

## 2019-11-06 DIAGNOSIS — Z23 Encounter for immunization: Secondary | ICD-10-CM

## 2019-11-06 NOTE — Progress Notes (Signed)
   Covid-19 Vaccination Clinic  Name:  [name] [name]    MRN: [id number] DOB: 1951/01/03  11/06/2019  [name] was observed post Covid-19 immunization for 15 minutes without incident. She was provided with Vaccine Information Sheet and instruction to access the V-Safe system.   [name] was instructed to call 911 with any severe reactions post vaccine: Marland Kitchen Difficulty breathing  . Swelling of face and throat  . A fast heartbeat  . A bad rash all over body  . Dizziness and weakness   Immunizations Administered    Name Date Dose VIS Date Route   Pfizer COVID-19 Vaccine 11/06/2019  4:51 PM 0.3 mL 07/23/2019 Intramuscular   Manufacturer: [place]   Lot: [id number]   [place]: [id number]

## 2019-11-17 ENCOUNTER — Ambulatory Visit: Payer: PPO

## 2019-11-17 DIAGNOSIS — Z6824 Body mass index (BMI) 24.0-24.9, adult: Secondary | ICD-10-CM | POA: Diagnosis not present

## 2019-11-17 DIAGNOSIS — K219 Gastro-esophageal reflux disease without esophagitis: Secondary | ICD-10-CM | POA: Diagnosis not present

## 2019-11-17 DIAGNOSIS — M810 Age-related osteoporosis without current pathological fracture: Secondary | ICD-10-CM | POA: Diagnosis not present

## 2020-09-14 ENCOUNTER — Telehealth: Payer: Self-pay | Admitting: Family Medicine

## 2020-09-14 NOTE — Telephone Encounter (Signed)
Left message for patient to call back and schedule Medicare Annual Wellness Visit (AWV) either virtually or in office.   Last AWV no information please schedule at anytime with [facility] Nurse Health Advisor 1 or 2   This should be a 45 minute visit. Patient also needs appointment with pcp last appointment 06/07/2019

## 2020-10-09 ENCOUNTER — Other Ambulatory Visit: Payer: Self-pay

## 2020-10-10 ENCOUNTER — Ambulatory Visit (INDEPENDENT_AMBULATORY_CARE_PROVIDER_SITE_OTHER): Payer: PPO | Admitting: Family Medicine

## 2020-10-10 ENCOUNTER — Encounter: Payer: Self-pay | Admitting: Family Medicine

## 2020-10-10 VITALS — BP 100/60 | HR 71 | Temp 97.9°F | Resp 16 | Ht 60.75 in | Wt 127.6 lb

## 2020-10-10 DIAGNOSIS — Z0000 Encounter for general adult medical examination without abnormal findings: Secondary | ICD-10-CM

## 2020-10-10 DIAGNOSIS — E785 Hyperlipidemia, unspecified: Secondary | ICD-10-CM

## 2020-10-10 DIAGNOSIS — R590 Localized enlarged lymph nodes: Secondary | ICD-10-CM | POA: Diagnosis not present

## 2020-10-10 LAB — LIPID PANEL
Cholesterol: 260 mg/dL — ABNORMAL HIGH (ref 0–200)
HDL: 84.8 mg/dL (ref >39.00)
LDL Cholesterol: 153 mg/dL — ABNORMAL HIGH (ref 0–99)
NonHDL: 174.9
Total CHOL/HDL Ratio: 3
Triglycerides: 112 mg/dL (ref 0.0–149.0)
VLDL: 22.4 mg/dL (ref 0.0–40.0)

## 2020-10-10 LAB — CBC
HCT: 37.9 % (ref 36.0–46.0)
Hemoglobin: 12.8 g/dL (ref 12.0–15.0)
MCHC: 33.8 g/dL (ref 30.0–36.0)
MCV: 91.9 fl (ref 78.0–100.0)
Platelets: 277 10*3/uL (ref 150.0–400.0)
RBC: 4.12 Mil/uL (ref 3.87–5.11)
RDW: 12.8 % (ref 11.5–15.5)
WBC: 4.1 10*3/uL (ref 4.0–10.5)

## 2020-10-10 LAB — BASIC METABOLIC PANEL
BUN: 15 mg/dL (ref 6–23)
CO2: 32 mEq/L (ref 19–32)
Calcium: 9.5 mg/dL (ref 8.4–10.5)
Chloride: 102 mEq/L (ref 96–112)
Creatinine, Ser: 0.76 mg/dL (ref 0.40–1.20)
GFR: 80.07 mL/min (ref >60.00)
Glucose, Bld: 86 mg/dL (ref 70–99)
Potassium: 4.5 mEq/L (ref 3.5–5.1)
Sodium: 139 mEq/L (ref 135–145)

## 2020-10-10 MED ORDER — SHINGRIX 50 MCG/0.5ML IM SUSR: INTRAMUSCULAR | 1 refills | Status: DC

## 2020-10-10 NOTE — Patient Instructions (Signed)
A few things to remember from today's visit:   Routine general medical examination at a health care facility  Hyperlipidemia, unspecified hyperlipidemia type - Plan: Basic metabolic panel, Lipid panel  Discrete lymph node - Plan: CBC  Please be sure medication list is accurate. If a new problem present, please set up appointment sooner than planned today.  Continue monitoring neck for changes.  A few tips:  -As we age balance is not as good as it was, so there is a higher risks for falls. Please remove small rugs and furniture that is "in your way" and could increase the risk of falls. Stretching exercises may help with fall prevention: Yoga and Tai Chi are some examples. Low impact exercise is better, so you are not very achy the next day.  -Sun screen and avoidance of direct sun light recommended. Caution with dehydration, if working outdoors be sure to drink enough fluids.  - Some medications are not safe as we age, increases the risk of side effects and can potentially interact with other medication you are also taken;  including some of over the counter medications. Be sure to let me know when you start a new medication even if it is a dietary/vitamin supplement.   -Healthy diet low in red meet/animal fat and sugar + regular physical activity is recommended.

## 2020-10-10 NOTE — Progress Notes (Signed)
HPI: [name] [name] is a pleasant 70 y.o. female, who is here today for her routine physical.  Last CPE: 06/07/19  Regular exercise 3 or more time per week: Active at home with yard work and 2 grand babies (7 months and 2 yo). Following a healthy diet: Yes, she tries to do so. She lives with her husband.  Chronic medical problems: GERD,HLD,osteoporosis. GERD: She is on Nexium 20 mg daily. She tried Pepcid but it did not help. She follows with Dr Fuller Plan.  She follows with gyn regularly, waiting for an appt. Pap smear in 04/2019.  Immunization History  Administered Date(s) Administered  . Fluad Quad(high Dose 65+) 06/07/2019  . PFIZER(Purple Top)SARS-COV-2 Vaccination 10/16/2019, 11/06/2019  . Pneumococcal Polysaccharide-23 06/07/2019   Mammogram: 05/12/19. She had had it at her gyn's office. Colonoscopy: 03/15/19, 5 years f/u was recommended. DEXA: 11/01/19: Osteoporosis. Following with gyn. She took Actonel for 15+ years. She is on Vit D and Ca++ supplementation.  Hep C screening: 06/07/19 NR  HLD: She is on non pharmacologic treatment. Lab Results  Component Value Date   CHOL 257 (H) 06/07/2019   HDL 84.90 06/07/2019   LDLCALC 146 (H) 06/07/2019   TRIG 132.0 06/07/2019   CHOLHDL 3 06/07/2019   Review of Systems  Constitutional: Negative for appetite change, fatigue and fever.  HENT: Negative for hearing loss, mouth sores and sore throat.   Eyes: Negative for redness and visual disturbance.  Respiratory: Negative for cough, shortness of breath and wheezing.   Cardiovascular: Negative for chest pain and leg swelling.  Gastrointestinal: Negative for abdominal pain, nausea and vomiting.       No changes in bowel habits.  Endocrine: Negative for cold intolerance, heat intolerance, polydipsia, polyphagia and polyuria.  Genitourinary: Negative for decreased urine volume, dysuria, hematuria, vaginal bleeding and vaginal discharge.  Musculoskeletal: Negative for gait  problem and myalgias.  Skin: Negative for color change and rash.  Allergic/Immunologic: Negative for environmental allergies.  Neurological: Negative for syncope, weakness and headaches.  Hematological: Negative for adenopathy. Does not bruise/bleed easily.  Psychiatric/Behavioral: Negative for behavioral problems and confusion.  All other systems reviewed and are negative.  Current Outpatient Medications on File Prior to Visit  Medication Sig Dispense Refill  . Biotin 5000 MCG CAPS Take 1 capsule by mouth.    . Calcium-Magnesium-Vitamin D (CALCIUM MAGNESIUM PO) Take 1 tablet by mouth.    . Cholecalciferol (VITAMIN D) 2000 UNITS tablet Take 2,000 Units by mouth daily.    . cyanocobalamin 500 MCG tablet Take 500 mcg by mouth daily.    Marland Kitchen GLUCOSAMINE PO Take 1 tablet by mouth daily.    Marland Kitchen VITAMIN K PO Take by mouth daily.     No current facility-administered medications on file prior to visit.   Past Medical History:  Diagnosis Date  . Adenomatous polyp of colon 1994  . Erosive esophagitis   . Fundic gland polyposis of stomach   . GERD (gastroesophageal reflux disease)   . Hiatal hernia   . Hyperlipidemia   . Internal hemorrhoids   . Osteoporosis    Past Surgical History:  Procedure Laterality Date  . BREAST ENHANCEMENT SURGERY    . CARPAL TUNNEL RELEASE     right  . FOOT SURGERY     bilateral    No Known Allergies  Family History  Problem Relation Age of Onset  . Hypertension Father   . Dementia Father   . Colon cancer Brother 58  . Esophageal cancer Neg  Hx   . Rectal cancer Neg Hx   . Stomach cancer Neg Hx     Social History   Socioeconomic History  . Marital status: Divorced    Spouse name: Not on file  . Number of children: 1  . Years of education: Not on file  . Highest education level: Not on file  Occupational History  . Occupation: rETIRED  Tobacco Use  . Smoking status: Never Smoker  . Smokeless tobacco: Never Used  Vaping Use  . Vaping Use: Never  used  Substance and Sexual Activity  . Alcohol use: Yes    Comment: rarely  . Drug use: No  . Sexual activity: Not on file  Other Topics Concern  . Not on file  Social History Narrative  . Not on file   Social Determinants of Health   Financial Resource Strain: Not on file  Food Insecurity: Not on file  Transportation Needs: Not on file  Physical Activity: Not on file  Stress: Not on file  Social Connections: Not on file   Vitals:   10/10/20 0839  BP: 100/60  Pulse: 71  Resp: 16  Temp: 97.9 F (36.6 C)  SpO2: 96%   Body mass index is 24.31 kg/m.  Wt Readings from Last 3 Encounters:  10/10/20 127 lb 9.6 oz (57.9 kg)  06/07/19 127 lb (57.6 kg)  04/13/19 129 lb (58.5 kg)   Physical Exam Vitals and nursing note reviewed.  Constitutional:      General: She is not in acute distress.    Appearance: She is well-developed and normal weight.  HENT:     Head: Normocephalic and atraumatic.     Right Ear: Hearing, tympanic membrane, ear canal and external ear normal.     Left Ear: Hearing, tympanic membrane, ear canal and external ear normal.     Mouth/Throat:     Mouth: Oropharynx is clear and moist and mucous membranes are normal.     Pharynx: Uvula midline.  Eyes:     Extraocular Movements: Extraocular movements intact and EOM normal.     Conjunctiva/sclera: Conjunctivae normal.     Pupils: Pupils are equal, round, and reactive to light.  Neck:     Thyroid: No thyromegaly.     Trachea: No tracheal deviation.  Cardiovascular:     Rate and Rhythm: Normal rate and regular rhythm.     Pulses:          Dorsalis pedis pulses are 2+ on the right side and 2+ on the left side.     Heart sounds: No murmur heard.   Pulmonary:     Effort: Pulmonary effort is normal. No respiratory distress.     Breath sounds: Normal breath sounds.  Chest:  Breasts:     Right: No supraclavicular adenopathy.     Left: No supraclavicular adenopathy.    Abdominal:     Palpations:  Abdomen is soft. There is no hepatomegaly or mass.     Tenderness: There is no abdominal tenderness.  Genitourinary:    Comments: Deferred to gyn. Musculoskeletal:        General: No edema.     Comments: No signs of synovitis appreciated.  Lymphadenopathy:     Cervical: No cervical adenopathy.     Upper Body:     Right upper body: No supraclavicular adenopathy.     Left upper body: No supraclavicular adenopathy.     Comments: Right posterior cervical palpable lymph node, about 1cm, reported as unchanged for years.  Skin:    General: Skin is warm.     Findings: No erythema or rash.  Neurological:     General: No focal deficit present.     Mental Status: She is alert and oriented to person, place, and time.     Cranial Nerves: No cranial nerve deficit.     Gait: Gait normal.     Deep Tendon Reflexes: Strength normal.     Reflex Scores:      Bicep reflexes are 2+ on the right side and 2+ on the left side.      Patellar reflexes are 2+ on the right side and 2+ on the left side. Psychiatric:        Mood and Affect: Mood and affect normal.        Speech: Speech normal.     Comments: Well groomed, good eye contact.   ASSESSMENT AND PLAN:  Ms. Loa Idler was here today annual physical examination.  Orders Placed This Encounter  Procedures  . Basic metabolic panel  . Lipid panel  . CBC   Lab Results  Component Value Date   CHOL 260 (H) 10/10/2020   HDL 84.80 10/10/2020   LDLCALC 153 (H) 10/10/2020   TRIG 112.0 10/10/2020   CHOLHDL 3 10/10/2020   Lab Results  Component Value Date   WBC 4.1 10/10/2020   HGB 12.8 10/10/2020   HCT 37.9 10/10/2020   MCV 91.9 10/10/2020   PLT 277.0 10/10/2020   Lab Results  Component Value Date   CREATININE 0.76 10/10/2020   BUN 15 10/10/2020   NA 139 10/10/2020   K 4.5 10/10/2020   CL 102 10/10/2020   CO2 32 10/10/2020   Routine general medical examination at a health care facility We discussed the importance of staying  active, physically and mentally, as well as the benefits of a healthy/balance diet. Low impact exercise that involve stretching and strengthing are ideal. Vaccines up to date. Female preventive care with gyn. Fall precautions. The 10-year ASCVD risk score Mikey Bussing DC Brooke Bonito., et al., 2013) is: 5.3%   Values used to calculate the score:     Age: 74 years     Sex: Female     Is Non-Hispanic African American: No     Diabetic: No     Tobacco smoker: No     Systolic Blood Pressure: 811 mmHg     Is BP treated: No     HDL Cholesterol: 84.8 mg/dL     Total Cholesterol: 260 mg/dL  Hyperlipidemia, unspecified hyperlipidemia type Continue with non pharmacologic treatment. Further recommendations according to FLP result and 10 years CVD risk.  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 It doe snot seem suspicious for a serious process. She agrees with continuing monitoring for changes.  Other orders -     Zoster Vaccine Adjuvanted [facility]) injection; 0.5 ml in muscle and repeat in 8 weeks  Return in 1 year (on 10/10/2021) for cpe. AWV seems to be due.Marland Kitchen   Jakaya Jacobowitz G. [place], MD  [facility]. [place] office.   A few things to remember from today's visit:   Routine general medical examination at a health care facility  Hyperlipidemia, unspecified hyperlipidemia type - Plan: Basic metabolic panel, Lipid panel  Discrete lymph node - Plan: CBC  Please be sure medication list is accurate. If a new problem present, please set up appointment sooner than planned today.  Continue monitoring neck for changes.  A few tips:  -  As we age balance is not as good as it was, so there is a higher risks for falls. Please remove small rugs and furniture that is "in your way" and could increase the risk of falls. Stretching exercises may help with fall prevention: Yoga and Tai Chi are some examples. Low impact exercise is better, so you are not very achy the next  day.  -Sun screen and avoidance of direct sun light recommended. Caution with dehydration, if working outdoors be sure to drink enough fluids.  - Some medications are not safe as we age, increases the risk of side effects and can potentially interact with other medication you are also taken;  including some of over the counter medications. Be sure to let me know when you start a new medication even if it is a dietary/vitamin supplement.   -Healthy diet low in red meet/animal fat and sugar + regular physical activity is recommended.

## 2021-01-02 ENCOUNTER — Ambulatory Visit: Payer: PPO

## 2021-01-29 ENCOUNTER — Telehealth: Payer: Self-pay | Admitting: Family Medicine

## 2021-01-29 NOTE — Telephone Encounter (Signed)
Left message for patient to call back and schedule Medicare Annual Wellness Visit (AWV) either virtually or in office.   AWV-I PER PALMETTO 07/12/17 please schedule at anytime with [facility] Nurse Health Advisor 1 or 2   This should be a 45 minute visit.

## 2021-02-28 ENCOUNTER — Telehealth: Payer: Self-pay | Admitting: Family Medicine

## 2021-02-28 NOTE — Telephone Encounter (Signed)
Left message for patient to call back and schedule Medicare Annual Wellness Visit (AWV) either virtually or in office.   AWV-I PER PALMETTO 07/12/17  please schedule at anytime with [facility] Nurse Health Advisor 1 or 2   This should be a 45 minute visit.

## 2021-04-13 ENCOUNTER — Ambulatory Visit (INDEPENDENT_AMBULATORY_CARE_PROVIDER_SITE_OTHER): Payer: PPO

## 2021-04-13 ENCOUNTER — Other Ambulatory Visit: Payer: Self-pay

## 2021-04-13 VITALS — BP 110/74 | HR 74 | Temp 98.4°F | Ht 61.0 in | Wt 126.0 lb

## 2021-04-13 DIAGNOSIS — Z0000 Encounter for general adult medical examination without abnormal findings: Secondary | ICD-10-CM

## 2021-04-13 NOTE — Patient Instructions (Signed)
[name] [name] , Thank you for taking time to come for your Medicare Wellness Visit. I appreciate your ongoing commitment to your health goals. Please review the following plan we discussed and let me know if I can assist you in the future.   Screening recommendations/referrals: Colonoscopy: 03/15/2019  due 2025 Mammogram: scheduled 05/04/2021 Bone Density: 11/01/2019 Recommended yearly ophthalmology/optometry visit for glaucoma screening and checkup Recommended yearly dental visit for hygiene and checkup  Vaccinations: Influenza vaccine: due in fall 2022  Pneumococcal vaccine: completed  Tdap vaccine: due upon injury  Shingles vaccine: will consider information given     Advanced directives: none   Conditions/risks identified: none   Next appointment: none    Preventive Care 2 Years and Older, Female Preventive care refers to lifestyle choices and visits with your health care provider that can promote health and wellness. What does preventive care include? A yearly physical exam. This is also called an annual well check. Dental exams once or twice a year. Routine eye exams. Ask your health care provider how often you should have your eyes checked. Personal lifestyle choices, including: Daily care of your teeth and gums. Regular physical activity. Eating a healthy diet. Avoiding tobacco and drug use. Limiting alcohol use. Practicing safe sex. Taking low-dose aspirin every day. Taking vitamin and mineral supplements as recommended by your health care provider. What happens during an annual well check? The services and screenings done by your health care provider during your annual well check will depend on your age, overall health, lifestyle risk factors, and family history of disease. Counseling  Your health care provider may ask you questions about your: Alcohol use. Tobacco use. Drug use. Emotional well-being. Home and relationship well-being. Sexual activity. Eating  habits. History of falls. Memory and ability to understand (cognition). Work and work Statistician. Reproductive health. Screening  You may have the following tests or measurements: Height, weight, and BMI. Blood pressure. Lipid and cholesterol levels. These may be checked every 5 years, or more frequently if you are over 19 years old. Skin check. Lung cancer screening. You may have this screening every year starting at age 48 if you have a 30-pack-year history of smoking and currently smoke or have quit within the past 15 years. Fecal occult blood test (FOBT) of the stool. You may have this test every year starting at age 44. Flexible sigmoidoscopy or colonoscopy. You may have a sigmoidoscopy every 5 years or a colonoscopy every 10 years starting at age 61. Hepatitis C blood test. Hepatitis B blood test. Sexually transmitted disease (STD) testing. Diabetes screening. This is done by checking your blood sugar (glucose) after you have not eaten for a while (fasting). You may have this done every 1-3 years. Bone density scan. This is done to screen for osteoporosis. You may have this done starting at age 15. Mammogram. This may be done every 1-2 years. Talk to your health care provider about how often you should have regular mammograms. Talk with your health care provider about your test results, treatment options, and if necessary, the need for more tests. Vaccines  Your health care provider may recommend certain vaccines, such as: Influenza vaccine. This is recommended every year. Tetanus, diphtheria, and acellular pertussis (Tdap, Td) vaccine. You may need a Td booster every 10 years. Zoster vaccine. You may need this after age 66. Pneumococcal 13-valent conjugate (PCV13) vaccine. One dose is recommended after age 6. Pneumococcal polysaccharide (PPSV23) vaccine. One dose is recommended after age 27. Talk to your health  care provider about which screenings and vaccines you need and how  often you need them. This information is not intended to replace advice given to you by your health care provider. Make sure you discuss any questions you have with your health care provider. Document Released: 08/25/2015 Document Revised: 04/17/2016 Document Reviewed: 05/30/2015 Elsevier Interactive Patient Education  2017 [place] Prevention in the Home Falls can cause injuries. They can happen to people of all ages. There are many things you can do to make your home safe and to help prevent falls. What can I do on the outside of my home? Regularly fix the edges of walkways and driveways and fix any cracks. Remove anything that might make you trip as you walk through a door, such as a raised step or threshold. Trim any bushes or trees on the path to your home. Use bright outdoor lighting. Clear any walking paths of anything that might make someone trip, such as rocks or tools. Regularly check to see if handrails are loose or broken. Make sure that both sides of any steps have handrails. Any raised decks and porches should have guardrails on the edges. Have any leaves, snow, or ice cleared regularly. Use sand or salt on walking paths during winter. Clean up any spills in your garage right away. This includes oil or grease spills. What can I do in the bathroom? Use night lights. Install grab bars by the toilet and in the tub and shower. Do not use towel bars as grab bars. Use non-skid mats or decals in the tub or shower. If you need to sit down in the shower, use a plastic, non-slip stool. Keep the floor dry. Clean up any water that spills on the floor as soon as it happens. Remove soap buildup in the tub or shower regularly. Attach bath mats securely with double-sided non-slip rug tape. Do not have throw rugs and other things on the floor that can make you trip. What can I do in the bedroom? Use night lights. Make sure that you have a light by your bed that is easy to  reach. Do not use any sheets or blankets that are too big for your bed. They should not hang down onto the floor. Have a firm chair that has side arms. You can use this for support while you get dressed. Do not have throw rugs and other things on the floor that can make you trip. What can I do in the kitchen? Clean up any spills right away. Avoid walking on wet floors. Keep items that you use a lot in easy-to-reach places. If you need to reach something above you, use a strong step stool that has a grab bar. Keep electrical cords out of the way. Do not use floor polish or wax that makes floors slippery. If you must use wax, use non-skid floor wax. Do not have throw rugs and other things on the floor that can make you trip. What can I do with my stairs? Do not leave any items on the stairs. Make sure that there are handrails on both sides of the stairs and use them. Fix handrails that are broken or loose. Make sure that handrails are as long as the stairways. Check any carpeting to make sure that it is firmly attached to the stairs. Fix any carpet that is loose or worn. Avoid having throw rugs at the top or bottom of the stairs. If you do have throw rugs, attach them to  the floor with carpet tape. Make sure that you have a light switch at the top of the stairs and the bottom of the stairs. If you do not have them, ask someone to add them for you. What else can I do to help prevent falls? Wear shoes that: Do not have high heels. Have rubber bottoms. Are comfortable and fit you well. Are closed at the toe. Do not wear sandals. If you use a stepladder: Make sure that it is fully opened. Do not climb a closed stepladder. Make sure that both sides of the stepladder are locked into place. Ask someone to hold it for you, if possible. Clearly mark and make sure that you can see: Any grab bars or handrails. First and last steps. Where the edge of each step is. Use tools that help you move  around (mobility aids) if they are needed. These include: Canes. Walkers. Scooters. Crutches. Turn on the lights when you go into a dark area. Replace any light bulbs as soon as they burn out. Set up your furniture so you have a clear path. Avoid moving your furniture around. If any of your floors are uneven, fix them. If there are any pets around you, be aware of where they are. Review your medicines with your doctor. Some medicines can make you feel dizzy. This can increase your chance of falling. Ask your doctor what other things that you can do to help prevent falls. This information is not intended to replace advice given to you by your health care provider. Make sure you discuss any questions you have with your health care provider. Document Released: 05/25/2009 Document Revised: 01/04/2016 Document Reviewed: 09/02/2014 Elsevier Interactive Patient Education  2017 [employer].

## 2021-04-13 NOTE — Progress Notes (Signed)
Subjective:   [name] [name] is a 70 y.o. female who presents for an Initial Medicare Annual Wellness Visit.  Review of Systems    N/a       Objective:    There were no vitals filed for this visit. There is no height or weight on file to calculate BMI.  No flowsheet data found.  Current Medications (verified) Outpatient Encounter Medications as of 04/13/2021  Medication Sig   Biotin 5000 MCG CAPS Take 1 capsule by mouth.   Calcium-Magnesium-Vitamin D (CALCIUM MAGNESIUM PO) Take 1 tablet by mouth.   Cholecalciferol (VITAMIN D) 2000 UNITS tablet Take 2,000 Units by mouth daily.   cyanocobalamin 500 MCG tablet Take 500 mcg by mouth daily.   GLUCOSAMINE PO Take 1 tablet by mouth daily.   VITAMIN K PO Take by mouth daily.   Zoster Vaccine Adjuvanted [facility]) injection 0.5 ml in muscle and repeat in 8 weeks   No facility-administered encounter medications on file as of 04/13/2021.    Allergies (verified) Patient has no known allergies.   History: Past Medical History:  Diagnosis Date   Adenomatous polyp of colon 1994   Erosive esophagitis    Fundic gland polyposis of stomach    GERD (gastroesophageal reflux disease)    Hiatal hernia    Hyperlipidemia    Internal hemorrhoids    Osteoporosis    Past Surgical History:  Procedure Laterality Date   BREAST ENHANCEMENT SURGERY     CARPAL TUNNEL RELEASE     right   FOOT SURGERY     bilateral   Family History  Problem Relation Age of Onset   Hypertension Father    Dementia Father    Colon cancer Brother 16   Esophageal cancer Neg Hx    Rectal cancer Neg Hx    Stomach cancer Neg Hx    Social History   Socioeconomic History   Marital status: Divorced    Spouse name: Not on file   Number of children: 1   Years of education: Not on file   Highest education level: Not on file  Occupational History   Occupation: rETIRED  Tobacco Use   Smoking status: Never   Smokeless tobacco: Never  Vaping Use   Vaping Use:  Never used  Substance and Sexual Activity   Alcohol use: Yes    Comment: rarely   Drug use: No   Sexual activity: Not on file  Other Topics Concern   Not on file  Social History Narrative   Not on file   Social Determinants of Health   Financial Resource Strain: Not on file  Food Insecurity: Not on file  Transportation Needs: Not on file  Physical Activity: Not on file  Stress: Not on file  Social Connections: Not on file    Tobacco Counseling Counseling given: Not Answered   Clinical Intake:                 Diabetic?no         Activities of Daily Living No flowsheet data found.  Patient Care Team: [place], Betty G, MD as PCP - General (Family Medicine)  Indicate any recent Medical Services you may have received from other than [facility] providers in the past year (date may be approximate).     Assessment:   This is a routine wellness examination for [name] [name].  Hearing/Vision screen No results found.  Dietary issues and exercise activities discussed:     Goals Addressed   None  Depression Screen PHQ 2/9 Scores 10/10/2020  PHQ - 2 Score 0    Fall Risk Fall Risk  10/10/2020  Falls in the past year? 0  Number falls in past yr: 0  Injury with Fall? 0  Follow up Education provided    [place]:  Any stairs in or around the home? Yes  If so, are there any without handrails? No  Home free of loose throw rugs in walkways, pet beds, electrical cords, etc? Yes  Adequate lighting in your home to reduce risk of falls? Yes   ASSISTIVE DEVICES UTILIZED TO PREVENT FALLS:  Life alert? No  Use of a cane, walker or w/c? No  Grab bars in the bathroom? No  Shower chair or bench in shower? Yes  Elevated toilet seat or a handicapped toilet? Yes   TIMED UP AND GO:  Was the test performed? Yes .  Length of time to ambulate 10 feet: 7 sec.   Gait steady and fast without use of assistive device  Cognitive Function:     Normal cognitive status assessed by direct observation by this Nurse Health Advisor. No abnormalities found.      Immunizations Immunization History  Administered Date(s) Administered   Fluad Quad(high Dose 65+) 06/07/2019   PFIZER(Purple Top)SARS-COV-2 Vaccination 10/16/2019, 11/06/2019   Pneumococcal Polysaccharide-23 06/07/2019    TDAP status: Up to date  Flu Vaccine status: Up to date  Pneumococcal vaccine status: Up to date  Covid-19 vaccine status: Completed vaccines  Qualifies for Shingles Vaccine? Yes   Zostavax completed No   Shingrix Completed?: No.    Education has been provided regarding the importance of this vaccine. Patient has been advised to call insurance company to determine out of pocket expense if they have not yet received this vaccine. Advised may also receive vaccine at local pharmacy or Health Dept. Verbalized acceptance and understanding.  Screening Tests Health Maintenance  Topic Date Due   Zoster Vaccines- Shingrix (1 of 2) Never done   COVID-19 Vaccine (3 - Booster for Pfizer series) 04/07/2020   INFLUENZA VACCINE  03/12/2021   MAMMOGRAM  05/11/2021   COLONOSCOPY (Pts 45-62yr Insurance coverage will need to be confirmed)  03/14/2024   DEXA SCAN  Completed   Hepatitis C Screening  Completed   HPV VACCINES  Aged Out   TETANUS/TDAP  Discontinued   PNA vac Low Risk Adult  Discontinued    Health Maintenance  Health Maintenance Due  Topic Date Due   Zoster Vaccines- Shingrix (1 of 2) Never done   COVID-19 Vaccine (3 - Booster for Pfizer series) 04/07/2020   INFLUENZA VACCINE  03/12/2021    Colorectal cancer screening: Type of screening: Colonoscopy. Completed 03/15/2019. Repeat every 5 years  Mammogram status: Completed Scheduled 05/04/2021. Repeat every year  Bone Density status: Completed 11/01/2019. Results reflect: Bone density results: OSTEOPENIA. Repeat every 5 years.  Lung Cancer Screening: (Low Dose CT Chest recommended if Age 70-80 years, 30 pack-year currently smoking OR have quit w/in 15years.) does not qualify.   Lung Cancer Screening Referral: n/a  Additional Screening:  Hepatitis C Screening: does not qualify; Completed 06/07/2019  Vision Screening: Recommended annual ophthalmology exams for early detection of glaucoma and other disorders of the eye. Is the patient up to date with their annual eye exam?  Yes  Who is the provider or what is the name of the office in which the patient attends annual eye exams? Dr.Snipes  If pt is not established  with a provider, would they like to be referred to a provider to establish care? No .   Dental Screening: Recommended annual dental exams for proper oral hygiene  Community Resource Referral / Chronic Care Management: CRR required this visit?  No   CCM required this visit?  No      Plan:     I have personally reviewed and noted the following in the patient's chart:   Medical and social history Use of alcohol, tobacco or illicit drugs  Current medications and supplements including opioid prescriptions. Patient is not currently taking opioid prescriptions. Functional ability and status Nutritional status Physical activity Advanced directives List of other physicians Hospitalizations, surgeries, and ER visits in previous 12 months Vitals Screenings to include cognitive, depression, and falls Referrals and appointments  In addition, I have reviewed and discussed with patient certain preventive protocols, quality metrics, and best practice recommendations. A written personalized care plan for preventive services as well as general preventive health recommendations were provided to patient.     Randel Pigg, LPN   [id number]   Nurse Notes: none

## 2021-05-04 DIAGNOSIS — Z01419 Encounter for gynecological examination (general) (routine) without abnormal findings: Secondary | ICD-10-CM | POA: Diagnosis not present

## 2021-05-04 DIAGNOSIS — Z1231 Encounter for screening mammogram for malignant neoplasm of breast: Secondary | ICD-10-CM | POA: Diagnosis not present

## 2022-02-25 NOTE — Progress Notes (Signed)
HPI: [name] is a 71 y.o. female, who is here today for her routine physical.  Last CPE: 10/10/20  She does not have an exercise routine but she is active with chores and yard work in her house and her mother's house. In general she tries to follow a healthful diet, she has fast food sometimes.  Chronic medical problems: GERD, osteoporosis, and hyperlipidemia among some. She follows with gastroenterologist.  Immunization History  Administered Date(s) Administered   Fluad Quad(high Dose 65+) 06/07/2019   PFIZER(Purple Top)SARS-COV-2 Vaccination 10/16/2019, 11/06/2019   Pneumococcal Polysaccharide-23 06/07/2019   Health Maintenance  Topic Date Due   Pneumonia Vaccine 72+ Years old (2 - PCV) 06/06/2020   COVID-19 Vaccine (3 - Pfizer series) 03/14/2022 (Originally 01/01/2020)   Zoster Vaccines- Shingrix (1 of 2) 08/16/2022 (Originally 07/23/2001)   INFLUENZA VACCINE  03/12/2022   MAMMOGRAM  05/04/2022   COLONOSCOPY (Pts 45-59yrs Insurance coverage will need to be confirmed)  03/14/2024   DEXA SCAN  Completed   Hepatitis C Screening  Completed   HPV VACCINES  Aged Out   TETANUS/TDAP  Discontinued   DEXA: 11/01/19: Osteoporosis. Following with gyn. She took Actonel for 15+ years. She is on Vit D and Ca++ supplementation. She sees gyn annually, last visit on 05/04/21.  She has no concerns today.  Review of Systems  Constitutional:  Negative for activity change, appetite change and fever.  HENT:  Negative for hearing loss, mouth sores, sore throat and trouble swallowing.   Eyes:  Negative for redness and visual disturbance.  Respiratory:  Negative for cough, shortness of breath and wheezing.   Cardiovascular:  Negative for chest pain and leg swelling.  Gastrointestinal:  Negative for abdominal pain, nausea and vomiting.       No changes in bowel habits.  Endocrine: Negative for cold intolerance, heat intolerance, polydipsia, polyphagia and polyuria.  Genitourinary:   Negative for decreased urine volume, dysuria, hematuria, vaginal bleeding and vaginal discharge.  Musculoskeletal:  Positive for arthralgias. Negative for gait problem and myalgias.  Skin:  Negative for color change and rash.  Allergic/Immunologic: Negative for environmental allergies.  Neurological:  Negative for seizures, syncope, weakness and headaches.  Hematological:  Negative for adenopathy. Does not bruise/bleed easily.  Psychiatric/Behavioral:  Negative for confusion. The patient is not nervous/anxious.   All other systems reviewed and are negative.  Current Outpatient Medications on File Prior to Visit  Medication Sig Dispense Refill   Biotin 5000 MCG CAPS Take 1 capsule by mouth.     Calcium-Magnesium-Vitamin D (CALCIUM MAGNESIUM PO) Take 1 tablet by mouth.     Cholecalciferol (VITAMIN D) 2000 UNITS tablet Take 2,000 Units by mouth daily.     cyanocobalamin 500 MCG tablet Take 500 mcg by mouth daily.     esomeprazole (NEXIUM) 40 MG capsule Take 40 mg by mouth daily at 12 noon.     GLUCOSAMINE PO Take 1 tablet by mouth daily.     VITAMIN K PO Take by mouth daily.     No current facility-administered medications on file prior to visit.   Past Medical History:  Diagnosis Date   Adenomatous polyp of colon 1994   Erosive esophagitis    Fundic gland polyposis of stomach    GERD (gastroesophageal reflux disease)    Hiatal hernia    Hyperlipidemia    Internal hemorrhoids    Osteoporosis    Past Surgical History:  Procedure Laterality Date   BREAST ENHANCEMENT SURGERY     CARPAL TUNNEL  RELEASE     right   FOOT SURGERY     bilateral   No Known Allergies  Family History  Problem Relation Age of Onset   Hypertension Father    Dementia Father    Colon cancer Brother 72   Esophageal cancer Neg Hx    Rectal cancer Neg Hx    Stomach cancer Neg Hx    Social History   Socioeconomic History   Marital status: Divorced    Spouse name: Not on file   Number of children: 1    Years of education: Not on file   Highest education level: Not on file  Occupational History   Occupation: rETIRED  Tobacco Use   Smoking status: Never   Smokeless tobacco: Never  Vaping Use   Vaping Use: Never used  Substance and Sexual Activity   Alcohol use: Yes    Comment: rarely   Drug use: No   Sexual activity: Not on file  Other Topics Concern   Not on file  Social History Narrative   Not on file   Social Determinants of Health   Financial Resource Strain: Low Risk  (04/13/2021)   Overall Financial Resource Strain (CARDIA)    Difficulty of Paying Living Expenses: Not hard at all  Food Insecurity: No Food Insecurity (04/13/2021)   Hunger Vital Sign    Worried About Running Out of Food in the Last Year: Never true    Ran Out of Food in the Last Year: Never true  Transportation Needs: No Transportation Needs (04/13/2021)   PRAPARE - Administrator, Civil Service (Medical): No    Lack of Transportation (Non-Medical): No  Physical Activity: Insufficiently Active (04/13/2021)   Exercise Vital Sign    Days of Exercise per Week: 3 days    Minutes of Exercise per Session: 30 min  Stress: No Stress Concern Present (04/13/2021)   [employer] of Occupational Health - Occupational Stress Questionnaire    Feeling of Stress : Not at all  Social Connections: Moderately Isolated (04/13/2021)   Social Connection and Isolation Panel [NHANES]    Frequency of Communication with Friends and Family: More than three times a week    Frequency of Social Gatherings with Friends and Family: More than three times a week    Attends Religious Services: Never    Database administrator or Organizations: No    Attends Banker Meetings: Not on file    Marital Status: Living with partner   Vitals:   02/26/22 0855  BP: 120/80  Pulse: 81  Resp: 16  SpO2: 97%  Body mass index is 23.5 kg/m.  Wt Readings from Last 3 Encounters:  02/26/22 124 lb 6 oz (56.4 kg)  04/13/21  126 lb (57.2 kg)  10/10/20 127 lb 9.6 oz (57.9 kg)   Physical Exam Vitals and nursing note reviewed.  Constitutional:      General: She is not in acute distress.    Appearance: She is well-developed.  HENT:     Head: Normocephalic and atraumatic.     Right Ear: Hearing, tympanic membrane, ear canal and external ear normal.     Left Ear: Hearing, tympanic membrane, ear canal and external ear normal.     Mouth/Throat:     Mouth: Mucous membranes are moist.     Pharynx: Oropharynx is clear. Uvula midline.  Eyes:     Extraocular Movements: Extraocular movements intact.     Conjunctiva/sclera: Conjunctivae normal.  Pupils: Pupils are equal, round, and reactive to light.  Neck:     Thyroid: Thyromegaly (? of left tyroid nodule) present.     Trachea: No tracheal deviation.  Cardiovascular:     Rate and Rhythm: Normal rate and regular rhythm.     Pulses:          Dorsalis pedis pulses are 2+ on the right side and 2+ on the left side.     Heart sounds: No murmur heard. Pulmonary:     Effort: Pulmonary effort is normal. No respiratory distress.     Breath sounds: Normal breath sounds.  Abdominal:     Palpations: Abdomen is soft. There is no hepatomegaly or mass.     Tenderness: There is no abdominal tenderness.  Genitourinary:    Comments: Deferred to gyn. Musculoskeletal:     Comments: No signs of synovitis appreciated.  Lymphadenopathy:     Cervical: No cervical adenopathy.     Upper Body:     Right upper body: No supraclavicular adenopathy.     Left upper body: No supraclavicular adenopathy.  Skin:    General: Skin is warm.     Findings: No erythema or rash.  Neurological:     General: No focal deficit present.     Mental Status: She is alert and oriented to person, place, and time.     Cranial Nerves: No cranial nerve deficit.     Coordination: Coordination normal.     Gait: Gait normal.     Deep Tendon Reflexes:     Reflex Scores:      Bicep reflexes are 2+ on the  right side and 2+ on the left side.      Patellar reflexes are 2+ on the right side and 2+ on the left side. Psychiatric:     Comments: Well groomed, good eye contact.   ASSESSMENT AND PLAN:  Ms. Florastine Meckes was here today annual physical examination.  Orders Placed This Encounter  Procedures   US THYROID   Comprehensive metabolic panel   TSH   Lipid panel   Lab Results  Component Value Date   CHOL 250 (H) 02/26/2022   HDL 82.90 02/26/2022   LDLCALC 147 (H) 02/26/2022   TRIG 103.0 02/26/2022   CHOLHDL 3 02/26/2022   Lab Results  Component Value Date   TSH 1.30 02/26/2022   Lab Results  Component Value Date   CREATININE 0.83 02/26/2022   BUN 19 02/26/2022   NA 140 02/26/2022   K 4.4 02/26/2022   CL 104 02/26/2022   CO2 31 02/26/2022   Lab Results  Component Value Date   ALT 23 02/26/2022   AST 22 02/26/2022   ALKPHOS 58 02/26/2022   BILITOT 0.5 02/26/2022   Routine general medical examination at a health care facility We discussed the importance of regular physical activity and healthy diet for prevention of chronic illness and/or complications. Preventive guidelines reviewed. Vaccination updated. Ca++ and vit D supplementation to continue. Next CPE in a year.  The 10-year ASCVD risk score (Arnett DK, et al., 2019) is: 8.3%   Values used to calculate the score:     Age: 89 years     Sex: Female     Is Non-Hispanic African American: No     Diabetic: No     Tobacco smoker: No     Systolic Blood Pressure: 120 mmHg     Is BP treated: No     HDL Cholesterol: 82.9 mg/dL  Total Cholesterol: 250 mg/dL  Enlarged thyroid gland ? Left thyroid nodule. We discussed some options, including monitoring for changes and proceed with imaging according to TSH result versus ordering thyroid US now, she prefers the latter one. Further recommendation will be given according to lab and imaging results.  Localized osteoporosis without current pathological  fracture Took Actonel years ago. Following with gyn. Continue Ca++ and vit D supplementation. Regular physical activity as tolerated and fall prevention.  Hyperlipidemia Non pharmacologic treatment recommended for now. Further recommendations will be given according to 10 years CVD risk score and lipid panel numbers.  Return in 1 year (on 02/27/2023) for cpe.  Tyisha Cressy G. [place], MD  [facility]. [place] office.

## 2022-02-26 ENCOUNTER — Ambulatory Visit
Admission: RE | Admit: 2022-02-26 | Discharge: 2022-02-26 | Disposition: A | Discharge status: Home / Self Care | Payer: PPO | Source: Ambulatory Visit | Attending: Family Medicine | Admitting: Family Medicine

## 2022-02-26 ENCOUNTER — Encounter: Payer: Self-pay | Admitting: Family Medicine

## 2022-02-26 ENCOUNTER — Ambulatory Visit (INDEPENDENT_AMBULATORY_CARE_PROVIDER_SITE_OTHER): Payer: PPO | Admitting: Family Medicine

## 2022-02-26 VITALS — BP 120/80 | HR 81 | Resp 16 | Ht 61.0 in | Wt 124.4 lb

## 2022-02-26 DIAGNOSIS — Z0000 Encounter for general adult medical examination without abnormal findings: Secondary | ICD-10-CM | POA: Diagnosis not present

## 2022-02-26 DIAGNOSIS — E049 Nontoxic goiter, unspecified: Secondary | ICD-10-CM | POA: Diagnosis not present

## 2022-02-26 DIAGNOSIS — M816 Localized osteoporosis [Lequesne]: Secondary | ICD-10-CM | POA: Diagnosis not present

## 2022-02-26 DIAGNOSIS — E785 Hyperlipidemia, unspecified: Secondary | ICD-10-CM

## 2022-02-26 DIAGNOSIS — Z23 Encounter for immunization: Secondary | ICD-10-CM | POA: Diagnosis not present

## 2022-02-26 LAB — COMPREHENSIVE METABOLIC PANEL
ALT: 23 U/L (ref 0–35)
AST: 22 U/L (ref 0–37)
Albumin: 4.4 g/dL (ref 3.5–5.2)
Alkaline Phosphatase: 58 U/L (ref 39–117)
BUN: 19 mg/dL (ref 6–23)
CO2: 31 mEq/L (ref 19–32)
Calcium: 9.5 mg/dL (ref 8.4–10.5)
Chloride: 104 mEq/L (ref 96–112)
Creatinine, Ser: 0.83 mg/dL (ref 0.40–1.20)
GFR: 71.34 mL/min (ref >60.00)
Glucose, Bld: 85 mg/dL (ref 70–99)
Potassium: 4.4 mEq/L (ref 3.5–5.1)
Sodium: 140 mEq/L (ref 135–145)
Total Bilirubin: 0.5 mg/dL (ref 0.2–1.2)
Total Protein: 6.6 g/dL (ref 6.0–8.3)

## 2022-02-26 LAB — LIPID PANEL
Cholesterol: 250 mg/dL — ABNORMAL HIGH (ref 0–200)
HDL: 82.9 mg/dL (ref >39.00)
LDL Cholesterol: 147 mg/dL — ABNORMAL HIGH (ref 0–99)
NonHDL: 167.5
Total CHOL/HDL Ratio: 3
Triglycerides: 103 mg/dL (ref 0.0–149.0)
VLDL: 20.6 mg/dL (ref 0.0–40.0)

## 2022-02-26 LAB — TSH: TSH: 1.3 u[IU]/mL (ref 0.35–5.50)

## 2022-02-26 NOTE — Assessment & Plan Note (Signed)
Took Actonel years ago. Following with gyn. Continue Ca++ and vit D supplementation. Regular physical activity as tolerated and fall prevention.

## 2022-02-26 NOTE — Assessment & Plan Note (Signed)
Non pharmacologic treatment recommended for now. Further recommendations will be given according to 10 years CVD risk score and lipid panel numbers. 

## 2022-02-26 NOTE — Patient Instructions (Addendum)
A few things to remember from today's visit:   Routine general medical examination at a health care facility  Hyperlipidemia, unspecified hyperlipidemia type - Plan: Comprehensive metabolic panel, Lipid panel  Localized osteoporosis without current pathological fracture  Enlarged thyroid gland - Plan: TSH, US THYROID  If you need refills please call your pharmacy. Do not use My Chart to request refills or for acute issues that need immediate attention.    Please be sure medication list is accurate. If a new problem present, please set up appointment sooner than planned today. Preventive Care 39 Years and Older, Female Preventive care refers to lifestyle choices and visits with your health care provider that can promote health and wellness. Preventive care visits are also called wellness exams. What can I expect for my preventive care visit? Counseling Your health care provider may ask you questions about your: Medical history, including: Past medical problems. Family medical history. Pregnancy and menstrual history. History of falls. Current health, including: Memory and ability to understand (cognition). Emotional well-being. Home life and relationship well-being. Sexual activity and sexual health. Lifestyle, including: Alcohol, nicotine or tobacco, and drug use. Access to firearms. Diet, exercise, and sleep habits. Work and work Statistician. Sunscreen use. Safety issues such as seatbelt and bike helmet use. Physical exam Your health care provider will check your: Height and weight. These may be used to calculate your BMI (body mass index). BMI is a measurement that tells if you are at a healthy weight. Waist circumference. This measures the distance around your waistline. This measurement also tells if you are at a healthy weight and may help predict your risk of certain diseases, such as type 2 diabetes and high blood pressure. Heart rate and blood pressure. Body  temperature. Skin for abnormal spots. What immunizations do I need?  Vaccines are usually given at various ages, according to a schedule. Your health care provider will recommend vaccines for you based on your age, medical history, and lifestyle or other factors, such as travel or where you work. What tests do I need? Screening Your health care provider may recommend screening tests for certain conditions. This may include: Lipid and cholesterol levels. Hepatitis C test. Hepatitis B test. HIV (human immunodeficiency virus) test. STI (sexually transmitted infection) testing, if you are at risk. Lung cancer screening. Colorectal cancer screening. Diabetes screening. This is done by checking your blood sugar (glucose) after you have not eaten for a while (fasting). Mammogram. Talk with your health care provider about how often you should have regular mammograms. BRCA-related cancer screening. This may be done if you have a family history of breast, ovarian, tubal, or peritoneal cancers. Bone density scan. This is done to screen for osteoporosis. Talk with your health care provider about your test results, treatment options, and if necessary, the need for more tests. Follow these instructions at home: Eating and drinking  Eat a diet that includes fresh fruits and vegetables, whole grains, lean protein, and low-fat dairy products. Limit your intake of foods with high amounts of sugar, saturated fats, and salt. Take vitamin and mineral supplements as recommended by your health care provider. Do not drink alcohol if your health care provider tells you not to drink. If you drink alcohol: Limit how much you have to 0-1 drink a day. Know how much alcohol is in your drink. In the [place] one drink equals one 12 oz bottle of beer (355 mL), one 5 oz glass of wine (148 mL), or one 1 oz glass of  hard liquor (44 mL). Lifestyle Brush your teeth every morning and night with fluoride toothpaste. Floss one  time each day. Exercise for at least 30 minutes 5 or more days each week. Do not use any products that contain nicotine or tobacco. These products include cigarettes, chewing tobacco, and vaping devices, such as e-cigarettes. If you need help quitting, ask your health care provider. Do not use drugs. If you are sexually active, practice safe sex. Use a condom or other form of protection in order to prevent STIs. Take aspirin only as told by your health care provider. Make sure that you understand how much to take and what form to take. Work with your health care provider to find out whether it is safe and beneficial for you to take aspirin daily. Ask your health care provider if you need to take a cholesterol-lowering medicine (statin). Find healthy ways to manage stress, such as: Meditation, yoga, or listening to music. Journaling. Talking to a trusted person. Spending time with friends and family. Minimize exposure to UV radiation to reduce your risk of skin cancer. Safety Always wear your seat belt while driving or riding in a vehicle. Do not drive: If you have been drinking alcohol. Do not ride with someone who has been drinking. When you are tired or distracted. While texting. If you have been using any mind-altering substances or drugs. Wear a helmet and other protective equipment during sports activities. If you have firearms in your house, make sure you follow all gun safety procedures. What's next? Visit your health care provider once a year for an annual wellness visit. Ask your health care provider how often you should have your eyes and teeth checked. Stay up to date on all vaccines. This information is not intended to replace advice given to you by your health care provider. Make sure you discuss any questions you have with your health care provider. Document Revised: 01/24/2021 Document Reviewed: 01/24/2021 Elsevier Patient Education  [place].

## 2022-04-10 ENCOUNTER — Telehealth: Payer: Self-pay | Admitting: Family Medicine

## 2022-04-10 NOTE — Telephone Encounter (Signed)
Left message for patient to call back and schedule Medicare Annual Wellness Visit (AWV) either virtually or in office. Left  my [name] number [phone]   Last AWV 04/13/21 ; please schedule at anytime with [facility] Nurse Health Advisor 1 or 2

## 2022-05-06 ENCOUNTER — Telehealth: Payer: Self-pay | Admitting: Family Medicine

## 2022-05-06 NOTE — Telephone Encounter (Signed)
Left message for patient to call back and schedule Medicare Annual Wellness Visit (AWV) either virtually or in office. Left  my [name] number [phone]   Last AWV 04/13/21  ; please schedule at anytime with [facility] Nurse Health Advisor 1 or 2

## 2022-06-18 ENCOUNTER — Telehealth: Payer: Self-pay | Admitting: Family Medicine

## 2022-06-18 NOTE — Telephone Encounter (Signed)
Left message for patient to call back and schedule Medicare Annual Wellness Visit (AWV) either virtually or in office. Left  my [name] number [phone]   Last AWV 04/13/21 please schedule with Nurse Health Adviser   45 min for awv-i and in office appointments 30 min for awv-s  phone/virtual appointments

## 2022-07-18 ENCOUNTER — Telehealth: Payer: Self-pay | Admitting: Family Medicine

## 2022-07-18 NOTE — Telephone Encounter (Signed)
Left message for patient to call back and schedule Medicare Annual Wellness Visit (AWV) either virtually or in office. Left  my [name] [name] number [phone]   Last AWV  04/13/21 please schedule with Nurse Health Adviser   45 min for awv-i and in office appointments 30 min for awv-s  phone/virtual appointments

## 2022-07-30 ENCOUNTER — Other Ambulatory Visit: Payer: Self-pay | Admitting: Obstetrics and Gynecology

## 2022-07-30 DIAGNOSIS — M810 Age-related osteoporosis without current pathological fracture: Secondary | ICD-10-CM

## 2022-09-20 ENCOUNTER — Telehealth: Payer: Self-pay | Admitting: Family Medicine

## 2022-09-20 NOTE — Telephone Encounter (Signed)
Returned patient call l/m   Pt returned my call

## 2022-09-20 NOTE — Telephone Encounter (Signed)
Left message for patient to call back and schedule Medicare Annual Wellness Visit (AWV) either virtually or in office. Left  my [name] number [phone]   Last AWV 04/13/21 please schedule with Nurse Health Adviser   45 min for awv-i  in office appointments 30 min for awv-s & awv-i phone/virtual appointments

## 2022-11-26 ENCOUNTER — Telehealth: Payer: Self-pay | Admitting: Family Medicine

## 2022-11-26 NOTE — Telephone Encounter (Signed)
Called patient to schedule Medicare Annual Wellness Visit (AWV). Left message for patient to call back and schedule Medicare Annual Wellness Visit (AWV).  Last date of AWV:04/13/21  Please schedule an appointment at any time with [facility] or hannah kim.  If any questions, please contact me at [phone].  Thank you ,  Rudell Cobb AWV direct phone # [phone]

## 2023-01-21 ENCOUNTER — Ambulatory Visit
Admission: RE | Admit: 2023-01-21 | Discharge: 2023-01-21 | Disposition: A | Discharge status: Home / Self Care | Payer: PPO | Source: Ambulatory Visit | Attending: Obstetrics and Gynecology | Admitting: Obstetrics and Gynecology

## 2023-01-21 DIAGNOSIS — M810 Age-related osteoporosis without current pathological fracture: Secondary | ICD-10-CM

## 2023-08-26 ENCOUNTER — Encounter: Payer: PPO | Admitting: Family Medicine

## 2023-08-26 DIAGNOSIS — E785 Hyperlipidemia, unspecified: Secondary | ICD-10-CM

## 2023-08-29 NOTE — Progress Notes (Signed)
HPI: [name] is a 73 y.o. female with a PMHx significant for GERD, osteoporosis, and HLD, among some, who is here today for her routine physical.  Last CPE: 02/26/2022.  She has followed with gynecologist, audiologist, and ENT in 2025.   Exercise: Patient states she walks ~10,000 steps per day.  Diet: Patient states she eats out frequently. She eats mainly chicken. Does not eat vegetables daily.  Sleep: 6-7 hours per night.  Alcohol Use: rarely Smoking: never Vision: UTD on routine vision care. Dental: UTD on routine dental care.   Immunization History  Administered Date(s) Administered   Fluad Quad(high Dose 65+) 06/07/2019   PFIZER(Purple Top)SARS-COV-2 Vaccination 10/16/2019, 11/06/2019   PNEUMOCOCCAL CONJUGATE-20 02/26/2022   Pneumococcal Polysaccharide-23 06/07/2019   Health Maintenance  Topic Date Due   Medicare Annual Wellness (AWV)  04/13/2022   COVID-19 Vaccine (3 - 2024-25 season) 09/17/2023 (Originally 04/13/2023)   INFLUENZA VACCINE  11/10/2023 (Originally 03/13/2023)   DTaP/Tdap/Td (1 - Tdap) 08/20/2024 (Originally [birth year]1970)   Zoster Vaccines- Shingrix (1 of 2) 08/20/2024 (Originally 07/23/2001)   Colonoscopy  03/14/2024   MAMMOGRAM  07/21/2024   Pneumonia Vaccine 81+ Years old  Completed   DEXA SCAN  Completed   Hepatitis C Screening  Completed   HPV VACCINES  Aged Out   Chronic medical problems:   Hyperlipidemia: Not currently on pharmacologic treatment.  Lab Results  Component Value Date   CHOL 250 (H) 02/26/2022   HDL 82.90 02/26/2022   LDLCALC 147 (H) 02/26/2022   TRIG 103.0 02/26/2022   CHOLHDL 3 02/26/2022   GERD:  She is no longer taking nexium and is now taking pepcid.   Osteoporosis:  Not currently taking anything for osteoporosis. She took Actonel for 1 year in the past.  She takes cholecalciferol 2000 units daily.  Following with gynecologist. Denies any falls in the last year.   Thyroid nodule:  02/2022 thyroid US: 1.  Normal-sized thyroid with a single left nodule which does NOT meet criteria for biopsy or follow-up imaging.  She has not noted growth.  Lab Results  Component Value Date   TSH 1.30 02/26/2022   Patient reports no new concerns today.   Review of Systems  Constitutional:  Negative for activity change, appetite change and fever.  HENT:  Negative for hearing loss, mouth sores, sore throat and trouble swallowing.   Eyes:  Negative for redness and visual disturbance.  Respiratory:  Negative for cough, shortness of breath and wheezing.   Cardiovascular:  Negative for chest pain and leg swelling.  Gastrointestinal:  Negative for abdominal pain, nausea and vomiting.       No changes in bowel habits.  Endocrine: Negative for cold intolerance, heat intolerance, polydipsia, polyphagia and polyuria.  Genitourinary:  Negative for decreased urine volume, dysuria and hematuria.  Musculoskeletal:  Positive for arthralgias. Negative for gait problem and myalgias.  Skin:  Negative for color change and rash.  Allergic/Immunologic: Negative for environmental allergies.  Neurological:  Negative for syncope, weakness and headaches.  Hematological:  Negative for adenopathy. Does not bruise/bleed easily.  Psychiatric/Behavioral:  Negative for confusion. The patient is not nervous/anxious.   All other systems reviewed and are negative.  Current Outpatient Medications on File Prior to Visit  Medication Sig Dispense Refill   Biotin 5000 MCG CAPS Take 1 capsule by mouth.     Calcium-Magnesium-Vitamin D (CALCIUM MAGNESIUM PO) Take 1 tablet by mouth.     Cholecalciferol (VITAMIN D) 2000 UNITS tablet Take 2,000 Units by mouth  daily.     cyanocobalamin 500 MCG tablet Take 500 mcg by mouth daily.     esomeprazole (NEXIUM) 40 MG capsule Take 40 mg by mouth daily at 12 noon.     GLUCOSAMINE PO Take 1 tablet by mouth daily.     VITAMIN K PO Take by mouth daily.     No current facility-administered medications on  file prior to visit.   Past Medical History:  Diagnosis Date   Adenomatous polyp of colon 1994   Erosive esophagitis    Fundic gland polyposis of stomach    GERD (gastroesophageal reflux disease)    Hiatal hernia    Hyperlipidemia    Internal hemorrhoids    Osteoporosis    Past Surgical History:  Procedure Laterality Date   BREAST ENHANCEMENT SURGERY     CARPAL TUNNEL RELEASE     right   FOOT SURGERY     bilateral   No Known Allergies  Family History  Problem Relation Age of Onset   Hypertension Father    Dementia Father    Colon cancer Brother 43   Esophageal cancer Neg Hx    Rectal cancer Neg Hx    Stomach cancer Neg Hx    Social History   Socioeconomic History   Marital status: Divorced    Spouse name: Not on file   Number of children: 1   Years of education: Not on file   Highest education level: Not on file  Occupational History   Occupation: rETIRED  Tobacco Use   Smoking status: Never   Smokeless tobacco: Never  Vaping Use   Vaping status: Never Used  Substance and Sexual Activity   Alcohol use: Yes    Comment: rarely   Drug use: No   Sexual activity: Not on file  Other Topics Concern   Not on file  Social History Narrative   Not on file   Social Drivers of Health   Financial Resource Strain: Low Risk  (04/13/2021)   Overall Financial Resource Strain (CARDIA)    Difficulty of Paying Living Expenses: Not hard at all  Food Insecurity: No Food Insecurity (04/13/2021)   Hunger Vital Sign    Worried About Running Out of Food in the Last Year: Never true    Ran Out of Food in the Last Year: Never true  Transportation Needs: No Transportation Needs (04/13/2021)   PRAPARE - Administrator, Civil Service (Medical): No    Lack of Transportation (Non-Medical): No  Physical Activity: Insufficiently Active (04/13/2021)   Exercise Vital Sign    Days of Exercise per Week: 3 days    Minutes of Exercise per Session: 30 min  Stress: No Stress Concern  Present (04/13/2021)   [employer] of Occupational Health - Occupational Stress Questionnaire    Feeling of Stress : Not at all  Social Connections: Moderately Isolated (04/13/2021)   Social Connection and Isolation Panel [NHANES]    Frequency of Communication with Friends and Family: More than three times a week    Frequency of Social Gatherings with Friends and Family: More than three times a week    Attends Religious Services: Never    Database administrator or Organizations: No    Attends Banker Meetings: Not on file    Marital Status: Living with partner   Vitals:   09/01/23 0813  BP: 128/78  Pulse: 85  Resp: 16  Temp: 98.1 F (36.7 C)  SpO2: 98%   Body mass  index is 24.28 kg/m.  Wt Readings from Last 3 Encounters:  09/01/23 128 lb 8 oz (58.3 kg)  02/26/22 124 lb 6 oz (56.4 kg)  04/13/21 126 lb (57.2 kg)   Physical Exam Vitals and nursing note reviewed.  Constitutional:      General: She is not in acute distress.    Appearance: She is well-developed.  HENT:     Head: Normocephalic and atraumatic.     Right Ear: Tympanic membrane, ear canal and external ear normal.     Left Ear: Tympanic membrane, ear canal and external ear normal.     Mouth/Throat:     Mouth: Mucous membranes are moist.     Pharynx: Oropharynx is clear. Uvula midline.  Eyes:     Extraocular Movements: Extraocular movements intact.     Conjunctiva/sclera: Conjunctivae normal.     Pupils: Pupils are equal, round, and reactive to light.  Neck:     Thyroid: Thyroid mass (small left-sided nodule) present. No thyromegaly.     Trachea: No tracheal deviation.  Cardiovascular:     Rate and Rhythm: Normal rate and regular rhythm.     Pulses:          Dorsalis pedis pulses are 2+ on the right side and 2+ on the left side.     Heart sounds: No murmur heard. Pulmonary:     Effort: Pulmonary effort is normal. No respiratory distress.     Breath sounds: Normal breath sounds.  Abdominal:      Palpations: Abdomen is soft. There is no hepatomegaly or mass.     Tenderness: There is no abdominal tenderness.  Genitourinary:    Comments: Deferred to gyn. Musculoskeletal:     Right lower leg: No edema.     Left lower leg: No edema.     Comments: No signs of synovitis appreciated.  Lymphadenopathy:     Cervical: No cervical adenopathy.  Skin:    General: Skin is warm.     Findings: No erythema or rash.  Neurological:     General: No focal deficit present.     Mental Status: She is alert and oriented to person, place, and time.     Cranial Nerves: No cranial nerve deficit.     Sensory: No sensory deficit.     Motor: No weakness.     Coordination: Coordination normal.     Gait: Gait normal.     Deep Tendon Reflexes:     Reflex Scores:      Bicep reflexes are 2+ on the right side and 2+ on the left side.      Patellar reflexes are 2+ on the right side and 2+ on the left side. Psychiatric:        Mood and Affect: Mood and affect normal.   ASSESSMENT AND PLAN:  Ms. [name] [name] was here today for her annual physical examination. Lab Results  Component Value Date   NA 141 09/01/2023   CL 103 09/01/2023   K 4.1 09/01/2023   CO2 30 09/01/2023   BUN 18 09/01/2023   CREATININE 0.75 09/01/2023   GFR 79.71 09/01/2023   CALCIUM 9.7 09/01/2023   ALBUMIN 4.5 09/01/2023   GLUCOSE 81 09/01/2023   Lab Results  Component Value Date   ALT 24 09/01/2023   AST 28 09/01/2023   ALKPHOS 66 09/01/2023   BILITOT 0.5 09/01/2023   Lab Results  Component Value Date   TSH 1.62 09/01/2023   Lab Results  Component Value  Date   CHOL 306 (H) 09/01/2023   HDL 87.90 09/01/2023   LDLCALC 197 (H) 09/01/2023   TRIG 108.0 09/01/2023   CHOLHDL 3 09/01/2023   Lab Results  Component Value Date   VD25OH 63.69 09/01/2023   Routine general medical examination at a health care facility Assessment & Plan: We discussed the importance of regular physical activity and healthy diet for  prevention of chronic illness and/or complications. Preventive guidelines reviewed. Vaccination: She is due for Shingrix, recommend getting it at her pharmacy. Adequate Ca++ and vit D supplementation to continue. Continue female preventive care with gynecologist. Reported last mammogram in 07/2023. Next CPE in a year.   Hyperlipidemia, unspecified hyperlipidemia type Assessment & Plan: Non pharmacologic treatment recommended for now. Further recommendations will be given according to 10 years CVD risk score and lipid panel numbers.  Orders: -     Comprehensive metabolic panel; Future -     Lipid panel; Future  Localized osteoporosis without current pathological fracture Assessment & Plan: Took Actonel years ago x 1 year. Continue adequate Ca++ and vit D supplementation. Recommend regular physical activity as tolerated, including weightbearing exercises 3 times per week, and fall prevention. Following with gynecologist.  Orders: -     Comprehensive metabolic panel; Future -     TSH; Future -     VITAMIN D 25 Hydroxy (Vit-D Deficiency, Fractures); Future  Left thyroid nodule Assessment & Plan: In 02/2022 thyroid US: Single left nodule which does NOT meet criteria for biopsy or follow-up imaging.  Will continue monitoring TSH annually.  Orders: -     TSH; Future   Return in 1 year (on 08/31/2024) for CPE.  I, Rolla Etienne Wierda, acting as a scribe for Malek Skog [place], MD., have documented all relevant documentation on the behalf of Doris Mcgilvery [place], MD, as directed by  Mertha Clyatt [place], MD while in the presence of Londen Bok [place], MD.   I, Norwin Aleman [place], MD, have reviewed all documentation for this visit. The documentation on 09/01/23 for the exam, diagnosis, procedures, and orders are all accurate and complete.  Ramere Downs G. [place], MD  [facility]. [place] office.

## 2023-09-01 ENCOUNTER — Encounter: Payer: Self-pay | Admitting: Family Medicine

## 2023-09-01 ENCOUNTER — Ambulatory Visit (INDEPENDENT_AMBULATORY_CARE_PROVIDER_SITE_OTHER): Payer: PPO | Admitting: Family Medicine

## 2023-09-01 VITALS — BP 128/78 | HR 85 | Temp 98.1°F | Resp 16 | Ht 61.0 in | Wt 128.5 lb

## 2023-09-01 DIAGNOSIS — E785 Hyperlipidemia, unspecified: Secondary | ICD-10-CM | POA: Diagnosis not present

## 2023-09-01 DIAGNOSIS — M816 Localized osteoporosis [Lequesne]: Secondary | ICD-10-CM

## 2023-09-01 DIAGNOSIS — E041 Nontoxic single thyroid nodule: Secondary | ICD-10-CM | POA: Insufficient documentation

## 2023-09-01 DIAGNOSIS — Z0000 Encounter for general adult medical examination without abnormal findings: Secondary | ICD-10-CM | POA: Diagnosis not present

## 2023-09-01 LAB — COMPREHENSIVE METABOLIC PANEL
ALT: 24 U/L (ref 0–35)
AST: 28 U/L (ref 0–37)
Albumin: 4.5 g/dL (ref 3.5–5.2)
Alkaline Phosphatase: 66 U/L (ref 39–117)
BUN: 18 mg/dL (ref 6–23)
CO2: 30 meq/L (ref 19–32)
Calcium: 9.7 mg/dL (ref 8.4–10.5)
Chloride: 103 meq/L (ref 96–112)
Creatinine, Ser: 0.75 mg/dL (ref 0.40–1.20)
GFR: 79.71 mL/min (ref >60.00)
Glucose, Bld: 81 mg/dL (ref 70–99)
Potassium: 4.1 meq/L (ref 3.5–5.1)
Sodium: 141 meq/L (ref 135–145)
Total Bilirubin: 0.5 mg/dL (ref 0.2–1.2)
Total Protein: 7 g/dL (ref 6.0–8.3)

## 2023-09-01 LAB — LIPID PANEL
Cholesterol: 306 mg/dL — ABNORMAL HIGH (ref 0–200)
HDL: 87.9 mg/dL (ref >39.00)
LDL Cholesterol: 197 mg/dL — ABNORMAL HIGH (ref 0–99)
NonHDL: 218.27
Total CHOL/HDL Ratio: 3
Triglycerides: 108 mg/dL (ref 0.0–149.0)
VLDL: 21.6 mg/dL (ref 0.0–40.0)

## 2023-09-01 LAB — TSH: TSH: 1.62 u[IU]/mL (ref 0.35–5.50)

## 2023-09-01 LAB — VITAMIN D 25 HYDROXY (VIT D DEFICIENCY, FRACTURES): VITD: 63.69 ng/mL (ref 30.00–100.00)

## 2023-09-01 NOTE — Assessment & Plan Note (Signed)
Non pharmacologic treatment recommended for now. Further recommendations will be given according to 10 years CVD risk score and lipid panel numbers. 

## 2023-09-01 NOTE — Assessment & Plan Note (Signed)
We discussed the importance of regular physical activity and healthy diet for prevention of chronic illness and/or complications. Preventive guidelines reviewed. Vaccination: She is due for Shingrix, recommend getting it at her pharmacy. Adequate Ca++ and vit D supplementation to continue. Continue female preventive care with gynecologist. Reported last mammogram in 07/2023. Next CPE in a year.

## 2023-09-01 NOTE — Assessment & Plan Note (Signed)
In 02/2022 thyroid US: Single left nodule which does NOT meet criteria for biopsy or follow-up imaging.  Will continue monitoring TSH annually.

## 2023-09-01 NOTE — Patient Instructions (Addendum)
A few things to remember from today's visit:  Routine general medical examination at a health care facility  Hyperlipidemia, unspecified hyperlipidemia type - Plan: Comprehensive metabolic panel, Lipid panel  Localized osteoporosis without current pathological fracture - Plan: Comprehensive metabolic panel, TSH, VITAMIN D 25 Hydroxy (Vit-D Deficiency, Fractures)  Left thyroid nodule - Plan: TSH  Try to get your shingles vaccine at your pharmacy.  Do not use My Chart to request refills or for acute issues that need immediate attention. If you send a my chart message, it may take a few days to be addressed, specially if I am not in the office.  Please be sure medication list is accurate. If a new problem present, please set up appointment sooner than planned today.

## 2023-09-01 NOTE — Assessment & Plan Note (Signed)
Took Actonel years ago x 1 year. Continue adequate Ca++ and vit D supplementation. Recommend regular physical activity as tolerated, including weightbearing exercises 3 times per week, and fall prevention. Following with gynecologist.

## 2024-03-16 ENCOUNTER — Ambulatory Visit

## 2024-05-03 ENCOUNTER — Ambulatory Visit

## 2024-09-01 ENCOUNTER — Ambulatory Visit: Admitting: Family Medicine

## 2024-09-01 ENCOUNTER — Ambulatory Visit: Payer: Self-pay | Admitting: Family Medicine

## 2024-09-01 ENCOUNTER — Encounter: Payer: Self-pay | Admitting: Family Medicine

## 2024-09-01 VITALS — BP 118/78 | HR 82 | Temp 97.9°F | Resp 16 | Ht 61.0 in | Wt 127.0 lb

## 2024-09-01 DIAGNOSIS — E785 Hyperlipidemia, unspecified: Secondary | ICD-10-CM | POA: Diagnosis not present

## 2024-09-01 DIAGNOSIS — M816 Localized osteoporosis [Lequesne]: Secondary | ICD-10-CM

## 2024-09-01 DIAGNOSIS — Z1211 Encounter for screening for malignant neoplasm of colon: Secondary | ICD-10-CM

## 2024-09-01 DIAGNOSIS — E041 Nontoxic single thyroid nodule: Secondary | ICD-10-CM | POA: Diagnosis not present

## 2024-09-01 DIAGNOSIS — Z0189 Encounter for other specified special examinations: Secondary | ICD-10-CM

## 2024-09-01 DIAGNOSIS — Z0000 Encounter for general adult medical examination without abnormal findings: Secondary | ICD-10-CM | POA: Diagnosis not present

## 2024-09-01 LAB — COMPREHENSIVE METABOLIC PANEL WITH GFR
ALT: 21 U/L (ref 3–35)
AST: 23 U/L (ref 5–37)
Albumin: 4.4 g/dL (ref 3.5–5.2)
Alkaline Phosphatase: 58 U/L (ref 39–117)
BUN: 15 mg/dL (ref 6–23)
CO2: 30 meq/L (ref 19–32)
Calcium: 9.4 mg/dL (ref 8.4–10.5)
Chloride: 101 meq/L (ref 96–112)
Creatinine, Ser: 0.83 mg/dL (ref 0.40–1.20)
GFR: 70.09 mL/min
Glucose, Bld: 85 mg/dL (ref 70–99)
Potassium: 4 meq/L (ref 3.5–5.1)
Sodium: 137 meq/L (ref 135–145)
Total Bilirubin: 0.4 mg/dL (ref 0.2–1.2)
Total Protein: 6.8 g/dL (ref 6.0–8.3)

## 2024-09-01 LAB — CBC
HCT: 39.1 % (ref 36.0–46.0)
Hemoglobin: 13.1 g/dL (ref 12.0–15.0)
MCHC: 33.7 g/dL (ref 30.0–36.0)
MCV: 91.7 fl (ref 78.0–100.0)
Platelets: 256 K/uL (ref 150.0–400.0)
RBC: 4.26 Mil/uL (ref 3.87–5.11)
RDW: 12.7 % (ref 11.5–15.5)
WBC: 4.3 K/uL (ref 4.0–10.5)

## 2024-09-01 LAB — LIPID PANEL
Cholesterol: 279 mg/dL — ABNORMAL HIGH (ref 28–200)
HDL: 85.4 mg/dL
LDL Cholesterol: 166 mg/dL — ABNORMAL HIGH (ref 10–99)
NonHDL: 193.91
Total CHOL/HDL Ratio: 3
Triglycerides: 142 mg/dL (ref 10.0–149.0)
VLDL: 28.4 mg/dL (ref 0.0–40.0)

## 2024-09-01 LAB — VITAMIN B12: Vitamin B-12: >1500 pg/mL — ABNORMAL HIGH (ref 211–911)

## 2024-09-01 LAB — VITAMIN D 25 HYDROXY (VIT D DEFICIENCY, FRACTURES): VITD: 39.87 ng/mL (ref 30.00–100.00)

## 2024-09-01 LAB — TSH: TSH: 1.55 u[IU]/mL (ref 0.35–5.50)

## 2024-09-01 LAB — MAGNESIUM: Magnesium: 2.2 mg/dL (ref 1.5–2.5)

## 2024-09-01 NOTE — Patient Instructions (Signed)
 A few things to remember from today's visit:  Routine general medical examination at a health care facility  Patient request for diagnostic testing - Plan: CBC, Magnesium, Vitamin B12  Localized osteoporosis without current pathological fracture - Plan: VITAMIN D  25 Hydroxy (Vit-D Deficiency, Fractures)  Hyperlipidemia, unspecified hyperlipidemia type - Plan: Comprehensive metabolic panel with GFR, Lipid panel  Colon cancer screening - Plan: Ambulatory referral to Gastroenterology  Left thyroid  nodule - Plan: TSH  Do not use My Chart to request refills or for acute issues that need immediate attention. If you send a my chart message, it may take a few days to be addressed, specially if I am not in the office.  Please be sure medication list is accurate. If a new problem present, please set up appointment sooner than planned today.

## 2024-09-01 NOTE — Assessment & Plan Note (Signed)
 Took Actonel years ago x 1 year. Continue adequate Ca++ and vit D supplementation. Fall precautions. Following with gynecologist.

## 2024-09-01 NOTE — Assessment & Plan Note (Signed)
 We discussed the importance of regular physical activity and healthy diet for prevention of chronic illness and/or complications. Preventive guidelines reviewed. Vaccination up to date. Ca++ and vit D supplementation to continue. She sees gynecologist annually for her female preventive care, appointment scheduled for next week. Next CPE in a year.

## 2024-09-01 NOTE — Progress Notes (Signed)
 "   Chief Complaint  Patient presents with   Annual Exam   Discussed the use of AI scribe software for clinical note transcription with the patient, who gave verbal consent to proceed. History of Present Illness [name] [name] is a 74 year old female with a PMHx significant for GERD, osteoporosis, and HLD, among some, who is here today for her routine physical.  Last CPE: 09/01/23  No new problems since her last visit.  She maintains her physical activity by walking a few times per week.  Her dietary habits include occasional cooking at home, primarily preparing meals with chicken and vegetables, and sometimes eating out or having sandwiches. She does not consume vegetables daily.  Her protein intake includes chicken, cottage cheese, cheese, and peanut butter.  She averages six hours of sleep per night, sometimes reaching seven or eight hours.  She denies alcohol consumption and has never smoked.  She has an upcoming gynecologist appointment.  No falls in the past year.  Immunization History  Administered Date(s) Administered   Fluad Quad(high Dose 65+) 06/07/2019   PFIZER(Purple Top)SARS-COV-2 Vaccination 10/16/2019, 11/06/2019   PNEUMOCOCCAL CONJUGATE-20 02/26/2022   Pneumococcal Polysaccharide-23 06/07/2019   Zoster Recombinant(Shingrix ) 03/16/2024, 05/21/2024   Health Maintenance  Topic Date Due   Medicare Annual Wellness (AWV)  04/13/2022   Colonoscopy  03/14/2024   Mammogram  07/21/2024   COVID-19 Vaccine (3 - 2025-26 season) 09/17/2024 (Originally 04/12/2024)   Influenza Vaccine  11/09/2024 (Originally 03/12/2024)   DTaP/Tdap/Td (1 - Tdap) 09/01/2025 (Originally 07/23/1970)   Pneumococcal Vaccine: 50+ Years  Completed   Bone Density Scan  Completed   Hepatitis C Screening  Completed   Zoster Vaccines- Shingrix   Completed   Meningococcal B Vaccine  Aged Out   Hyperlipidemia:Not currently on pharmacologic treatment.  Lab Results  Component Value Date   CHOL 306 (H)  09/01/2023   HDL 87.90 09/01/2023   LDLCALC 197 (H) 09/01/2023   TRIG 108.0 09/01/2023   CHOLHDL 3 09/01/2023   Osteoporosis: Not currently taking anything for osteoporosis.  She follows with gynecologist. Taking calcium and vitamin D  supplementation.  Thyroid  nodule: She has not noticed changes. 02/2022 thyroid  US : 1. Normal-sized thyroid  with a single left nodule which does NOT meet criteria for biopsy or follow-up imaging.  She has not noted growth.  Lab Results  Component Value Date   TSH 1.62 09/01/2023   She has at least of labs she would like to have done today: Magnesium, vitamin K, TSH, T3, white blood cells, cholesterol, and B12.  Review of Systems  Constitutional:  Negative for activity change, appetite change, chills and fever.  HENT:  Negative for mouth sores, sore throat and trouble swallowing.   Eyes:  Negative for redness and visual disturbance.  Respiratory:  Negative for cough, shortness of breath and wheezing.   Cardiovascular:  Negative for chest pain and leg swelling.  Gastrointestinal:  Negative for abdominal pain, nausea and vomiting.  Endocrine: Negative for cold intolerance, heat intolerance, polydipsia, polyphagia and polyuria.  Genitourinary:  Negative for decreased urine volume, dysuria and hematuria.  Musculoskeletal:  Positive for arthralgias. Negative for gait problem and myalgias.  Skin:  Negative for color change and rash.  Allergic/Immunologic: Negative for environmental allergies.  Neurological:  Negative for syncope, weakness and headaches.  Hematological:  Negative for adenopathy. Does not bruise/bleed easily.  Psychiatric/Behavioral:  Negative for confusion and hallucinations.   All other systems reviewed and are negative.  Current Outpatient Medications on File Prior to Visit  Medication Sig Dispense Refill   Biotin 5000 MCG CAPS Take 1 capsule by mouth.     Calcium-Magnesium-Vitamin D  (CALCIUM MAGNESIUM PO) Take 1 tablet by mouth.      Cholecalciferol (VITAMIN D ) 2000 UNITS tablet Take 2,000 Units by mouth daily.     cyanocobalamin 500 MCG tablet Take 500 mcg by mouth daily.     famotidine  (PEPCID ) 20 MG tablet Take 20 mg by mouth 2 (two) times daily.     Turmeric (QC TUMERIC COMPLEX) 500 MG CAPS Take 1 capsule by mouth daily.     VITAMIN K PO Take by mouth daily.     esomeprazole (NEXIUM) 40 MG capsule Take 40 mg by mouth daily at 12 noon. (Patient not taking: Reported on 09/01/2024)     GLUCOSAMINE PO Take 1 tablet by mouth daily. (Patient not taking: Reported on 09/01/2024)     No current facility-administered medications on file prior to visit.   Past Medical History:  Diagnosis Date   Adenomatous polyp of colon 1994   Erosive esophagitis    Fundic gland polyposis of stomach    GERD (gastroesophageal reflux disease)    Hiatal hernia    Hyperlipidemia    Internal hemorrhoids    Osteoporosis    Past Surgical History:  Procedure Laterality Date   BREAST ENHANCEMENT SURGERY     CARPAL TUNNEL RELEASE     right   FOOT SURGERY     bilateral   No Known Allergies  Family History  Problem Relation Age of Onset   Hypertension Father    Dementia Father    Colon cancer Brother 16   Esophageal cancer Neg Hx    Rectal cancer Neg Hx    Stomach cancer Neg Hx    Social History   Socioeconomic History   Marital status: Divorced    Spouse name: Not on file   Number of children: 1   Years of education: Not on file   Highest education level: Not on file  Occupational History   Occupation: rETIRED  Tobacco Use   Smoking status: Never   Smokeless tobacco: Never  Vaping Use   Vaping status: Never Used  Substance and Sexual Activity   Alcohol use: Yes    Comment: rarely   Drug use: No   Sexual activity: Not on file  Other Topics Concern   Not on file  Social History Narrative   Not on file   Social Drivers of Health   Tobacco Use: Low Risk (09/01/2024)   Patient History    Smoking Tobacco Use: Never     Smokeless Tobacco Use: Never    Passive Exposure: Not on file  Financial Resource Strain: Not on file  Food Insecurity: Not on file  Transportation Needs: Not on file  Physical Activity: Not on file  Stress: Not on file  Social Connections: Not on file  Depression (PHQ2-9): Low Risk (09/01/2024)   Depression (PHQ2-9)    PHQ-2 Score: 0  Alcohol Screen: Not on file  Housing: Not on file  Utilities: Not on file  Health Literacy: Not on file   Vitals:   09/01/24 0801  BP: 118/78  Pulse: 82  Resp: 16  Temp: 97.9 F (36.6 C)  SpO2: 98%   Body mass index is 24 kg/m.  Wt Readings from Last 3 Encounters:  09/01/24 127 lb (57.6 kg)  09/01/23 128 lb 8 oz (58.3 kg)  02/26/22 124 lb 6 oz (56.4 kg)   Physical Exam Vitals and nursing  note reviewed.  Constitutional:      General: She is not in acute distress.    Appearance: She is well-developed.  HENT:     Head: Normocephalic and atraumatic.     Right Ear: Tympanic membrane, ear canal and external ear normal.     Left Ear: Tympanic membrane, ear canal and external ear normal.     Mouth/Throat:     Mouth: Mucous membranes are moist.     Pharynx: Oropharynx is clear. Uvula midline.  Eyes:     Extraocular Movements: Extraocular movements intact.     Conjunctiva/sclera: Conjunctivae normal.     Pupils: Pupils are equal, round, and reactive to light.  Neck:     Thyroid : Thyroid  mass (small left-sided nodule, stable) present. No thyromegaly.  Cardiovascular:     Rate and Rhythm: Normal rate and regular rhythm.     Pulses:          Dorsalis pedis pulses are 2+ on the right side and 2+ on the left side.     Heart sounds: Murmur (soft SEM RUSB) heard.  Pulmonary:     Effort: Pulmonary effort is normal. No respiratory distress.     Breath sounds: Normal breath sounds.  Abdominal:     Palpations: Abdomen is soft. There is no hepatomegaly or mass.     Tenderness: There is no abdominal tenderness.  Genitourinary:    Comments:  Deferred to gyn. Musculoskeletal:     Right lower leg: No edema.     Left lower leg: No edema.     Comments: No signs of synovitis appreciated.  Lymphadenopathy:     Cervical: No cervical adenopathy.  Skin:    General: Skin is warm.     Findings: No erythema or rash.  Neurological:     General: No focal deficit present.     Mental Status: She is alert and oriented to person, place, and time.     Cranial Nerves: No cranial nerve deficit.     Sensory: No sensory deficit.     Motor: No weakness.     Coordination: Coordination normal.     Gait: Gait normal.     Deep Tendon Reflexes:     Reflex Scores:      Bicep reflexes are 2+ on the right side and 2+ on the left side.      Patellar reflexes are 2+ on the right side and 2+ on the left side. Psychiatric:        Mood and Affect: Mood and affect normal.   ASSESSMENT AND PLAN:  [name] [name] was here today for her annual physical examination. Orders Placed This Encounter  Procedures   CBC   Comprehensive metabolic panel with GFR   Magnesium   Vitamin B12   Lipid panel   TSH   VITAMIN D  25 Hydroxy (Vit-D Deficiency, Fractures)   Ambulatory referral to Gastroenterology   Lab Results  Component Value Date   VITAMINB12 >1500 (H) 09/01/2024   Lab Results  Component Value Date   WBC 4.3 09/01/2024   HGB 13.1 09/01/2024   HCT 39.1 09/01/2024   MCV 91.7 09/01/2024   PLT 256.0 09/01/2024   Lab Results  Component Value Date   NA 137 09/01/2024   CL 101 09/01/2024   K 4.0 09/01/2024   CO2 30 09/01/2024   BUN 15 09/01/2024   CREATININE 0.83 09/01/2024   GFR 70.09 09/01/2024   CALCIUM 9.4 09/01/2024   ALBUMIN 4.4 09/01/2024   GLUCOSE  85 09/01/2024   Lab Results  Component Value Date   ALT 21 09/01/2024   AST 23 09/01/2024   ALKPHOS 58 09/01/2024   BILITOT 0.4 09/01/2024   Lab Results  Component Value Date   VD25OH 39.87 09/01/2024   Lab Results  Component Value Date   TSH 1.55 09/01/2024   Lab Results   Component Value Date   CHOL 279 (H) 09/01/2024   HDL 85.40 09/01/2024   LDLCALC 166 (H) 09/01/2024   TRIG 142.0 09/01/2024   CHOLHDL 3 09/01/2024   Routine general medical examination at a health care facility Assessment & Plan: We discussed the importance of regular physical activity and healthy diet for prevention of chronic illness and/or complications. Preventive guidelines reviewed. Vaccination up to date. Ca++ and vit D supplementation to continue. She sees gynecologist annually for her female preventive care, appointment scheduled for next week. Next CPE in a year.   Patient request for diagnostic testing -     CBC; Future -     Magnesium; Future -     Vitamin B12; Future  Localized osteoporosis without current pathological fracture Assessment & Plan: Took Actonel years ago x 1 year. Continue adequate Ca++ and vit D supplementation. Fall precautions. Following with gynecologist.  Orders: -     VITAMIN D  25 Hydroxy (Vit-D Deficiency, Fractures); Future  Hyperlipidemia, unspecified hyperlipidemia type Assessment & Plan: Currently she is not on pharmacologic treatment, which has been recommended in the past. We discussed some not as effective OTC supplements that may help. We reviewed CV benefits of statins and current recommendations for pharmacologic treatment. Further recommendations will be given according to lipid panel result.  Orders: -     Comprehensive metabolic panel with GFR; Future -     Lipid panel; Future  Colon cancer screening -     Ambulatory referral to Gastroenterology  Left thyroid  nodule Assessment & Plan: Problem has been stable. In 02/2022 thyroid  US : Single left nodule which does NOT meet criteria for biopsy or follow-up imaging.  Will continue monitoring TSH annually.  Orders: -     TSH; Future  Return in 1 year (on 09/01/2025) for CPE.  Jacie Tristan G. Jashley Yellin, MD  [facility]. [place] office. "

## 2024-09-01 NOTE — Assessment & Plan Note (Signed)
 Currently she is not on pharmacologic treatment, which has been recommended in the past. We discussed some not as effective OTC supplements that may help. We reviewed CV benefits of statins and current recommendations for pharmacologic treatment. Further recommendations will be given according to lipid panel result.

## 2024-09-01 NOTE — Assessment & Plan Note (Signed)
 Problem has been stable. In 02/2022 thyroid  US : Single left nodule which does NOT meet criteria for biopsy or follow-up imaging.  Will continue monitoring TSH annually.

## 2024-09-03 NOTE — Telephone Encounter (Signed)
 [facility]. And sent message to her mychart account.
